# Patient Record
Sex: Male | Born: 1970
Health system: Southern US, Community
[De-identification: ages and names within clinical notes are randomized; demographics above are authoritative.]

## PROBLEM LIST (undated history)

## (undated) DIAGNOSIS — N200 Calculus of kidney: Secondary | ICD-10-CM

## (undated) DIAGNOSIS — I447 Left bundle-branch block, unspecified: Secondary | ICD-10-CM

## (undated) DIAGNOSIS — I10 Essential (primary) hypertension: Secondary | ICD-10-CM

## (undated) DIAGNOSIS — E119 Type 2 diabetes mellitus without complications: Secondary | ICD-10-CM

## (undated) DIAGNOSIS — E785 Hyperlipidemia, unspecified: Secondary | ICD-10-CM

## (undated) HISTORY — DX: Left bundle-branch block, unspecified: I44.7

## (undated) HISTORY — DX: Essential (primary) hypertension: I10

## (undated) HISTORY — DX: Hyperlipidemia, unspecified: E78.5

---

## 2001-07-06 ENCOUNTER — Encounter: Payer: Self-pay | Admitting: Emergency Medicine

## 2001-07-06 ENCOUNTER — Emergency Department (HOSPITAL_COMMUNITY): Admission: EM | Admit: 2001-07-06 | Discharge: 2001-07-06 | Payer: Self-pay | Admitting: Emergency Medicine

## 2002-06-29 ENCOUNTER — Encounter: Payer: Self-pay | Admitting: Family Medicine

## 2002-06-29 ENCOUNTER — Encounter: Admission: RE | Admit: 2002-06-29 | Discharge: 2002-06-29 | Payer: Self-pay | Admitting: Family Medicine

## 2005-01-11 ENCOUNTER — Encounter: Admission: RE | Admit: 2005-01-11 | Discharge: 2005-01-11 | Payer: Self-pay | Admitting: Occupational Medicine

## 2005-01-18 ENCOUNTER — Encounter: Admission: RE | Admit: 2005-01-18 | Discharge: 2005-01-18 | Payer: Self-pay | Admitting: Occupational Medicine

## 2009-04-06 ENCOUNTER — Emergency Department (HOSPITAL_COMMUNITY): Admission: EM | Admit: 2009-04-06 | Discharge: 2009-04-06 | Payer: Self-pay | Admitting: Emergency Medicine

## 2009-09-01 ENCOUNTER — Emergency Department (HOSPITAL_COMMUNITY): Admission: EM | Admit: 2009-09-01 | Discharge: 2009-09-01 | Payer: Self-pay | Admitting: Emergency Medicine

## 2010-08-15 LAB — DIFFERENTIAL
Basophils Absolute: 0 10*3/uL (ref 0.0–0.1)
Basophils Relative: 1 % (ref 0–1)
Lymphocytes Relative: 32 % (ref 12–46)
Neutro Abs: 2.8 10*3/uL (ref 1.7–7.7)
Neutrophils Relative %: 59 % (ref 43–77)

## 2010-08-15 LAB — COMPREHENSIVE METABOLIC PANEL
Albumin: 4.1 g/dL (ref 3.5–5.2)
Alkaline Phosphatase: 40 U/L (ref 39–117)
BUN: 9 mg/dL (ref 6–23)
Chloride: 104 mEq/L (ref 96–112)
Creatinine, Ser: 1.22 mg/dL (ref 0.4–1.5)
Glucose, Bld: 140 mg/dL — ABNORMAL HIGH (ref 70–99)
Potassium: 3.7 mEq/L (ref 3.5–5.1)
Total Bilirubin: 1.9 mg/dL — ABNORMAL HIGH (ref 0.3–1.2)

## 2010-08-15 LAB — CBC
HCT: 46.9 % (ref 39.0–52.0)
Hemoglobin: 16.1 g/dL (ref 13.0–17.0)
MCV: 88.8 fL (ref 78.0–100.0)
Platelets: 188 10*3/uL (ref 150–400)
RDW: 12.4 % (ref 11.5–15.5)
WBC: 4.7 10*3/uL (ref 4.0–10.5)

## 2010-08-15 LAB — POCT CARDIAC MARKERS
CKMB, poc: <1 ng/mL — ABNORMAL LOW (ref 1.0–8.0)
Myoglobin, poc: 64 ng/mL (ref 12–200)

## 2010-08-15 LAB — LIPASE, BLOOD: Lipase: 38 U/L (ref 11–59)

## 2010-08-29 LAB — DIFFERENTIAL
Basophils Absolute: 0 10*3/uL (ref 0.0–0.1)
Eosinophils Absolute: 0.1 10*3/uL (ref 0.0–0.7)
Eosinophils Relative: 2 % (ref 0–5)
Lymphocytes Relative: 26 % (ref 12–46)
Monocytes Absolute: 0.5 10*3/uL (ref 0.1–1.0)

## 2010-08-29 LAB — BASIC METABOLIC PANEL
BUN: 14 mg/dL (ref 6–23)
CO2: 24 mEq/L (ref 19–32)
Calcium: 9.1 mg/dL (ref 8.4–10.5)
Creatinine, Ser: 1.06 mg/dL (ref 0.4–1.5)
GFR calc non Af Amer: >60 mL/min (ref >60)
Glucose, Bld: 111 mg/dL — ABNORMAL HIGH (ref 70–99)
Sodium: 139 mEq/L (ref 135–145)

## 2010-08-29 LAB — CBC
Hemoglobin: 15.8 g/dL (ref 13.0–17.0)
MCHC: 34.8 g/dL (ref 30.0–36.0)
Platelets: 160 10*3/uL (ref 150–400)
RDW: 12.9 % (ref 11.5–15.5)

## 2010-08-29 LAB — POCT I-STAT, CHEM 8
BUN: 16 mg/dL (ref 6–23)
Calcium, Ion: 1.17 mmol/L (ref 1.12–1.32)
Chloride: 105 mEq/L (ref 96–112)
Glucose, Bld: 105 mg/dL — ABNORMAL HIGH (ref 70–99)
HCT: 46 % (ref 39.0–52.0)
TCO2: 23 mmol/L (ref 0–100)

## 2010-08-29 LAB — POCT CARDIAC MARKERS: Troponin i, poc: <0.05 ng/mL (ref 0.00–0.09)

## 2010-08-29 LAB — CK TOTAL AND CKMB (NOT AT ARMC): CK, MB: 1.7 ng/mL (ref 0.3–4.0)

## 2011-12-09 ENCOUNTER — Ambulatory Visit: Payer: Self-pay

## 2011-12-09 ENCOUNTER — Other Ambulatory Visit: Payer: Self-pay | Admitting: Occupational Medicine

## 2011-12-09 DIAGNOSIS — Z021 Encounter for pre-employment examination: Secondary | ICD-10-CM

## 2012-05-27 HISTORY — PX: HERNIA REPAIR: SHX51

## 2013-02-08 ENCOUNTER — Encounter (INDEPENDENT_AMBULATORY_CARE_PROVIDER_SITE_OTHER): Payer: Self-pay | Admitting: General Surgery

## 2013-02-08 ENCOUNTER — Encounter (INDEPENDENT_AMBULATORY_CARE_PROVIDER_SITE_OTHER): Payer: Self-pay | Admitting: Surgery

## 2013-02-08 ENCOUNTER — Ambulatory Visit (INDEPENDENT_AMBULATORY_CARE_PROVIDER_SITE_OTHER): Payer: BC Managed Care – PPO | Admitting: Surgery

## 2013-02-08 VITALS — BP 114/72 | HR 72 | Temp 98.6°F | Resp 14 | Ht 68.0 in | Wt 185.8 lb

## 2013-02-08 DIAGNOSIS — K4090 Unilateral inguinal hernia, without obstruction or gangrene, not specified as recurrent: Secondary | ICD-10-CM | POA: Insufficient documentation

## 2013-02-08 HISTORY — DX: Unilateral inguinal hernia, without obstruction or gangrene, not specified as recurrent: K40.90

## 2013-02-08 NOTE — Progress Notes (Signed)
Patient ID: Kyle Kyle [name], male   DOB: May 19, 1971, 42 y.o.   MRN: [id number]  Chief Complaint  Patient presents with  . New Evaluation    eval ing bulge    HPI Kyle Kyle [name] is Kyle [name] 42 y.o. male.   HPI This is Kyle [name] very pleasant gentleman referred by Dr. Dulce Sellar for evaluation of Kyle [name] symptomatic right inguinal hernia. He has had Kyle [name] hernia for some time. It is getting larger and causing more discomfort. The pain is moderate in intensity and described as an ache.  He has no nausea or vomiting or obstructive symptoms. He has no other complaints. He reports that The bulge easily reduces Past Medical History  Diagnosis Date  . Hyperlipidemia   . Hypertension   . Left bundle branch block   . Bundle branch block, left     History reviewed. No pertinent past surgical history.  Family History  Problem Relation Age of Onset  . Heart disease Father   . Cancer Maternal Aunt     breast    Social History History  Substance Use Topics  . Smoking status: Never Smoker   . Smokeless tobacco: Never Used  . Alcohol Use: Yes     Comment: rare    Allergies  Allergen Reactions  . Nitroglycerin     syncope     Current Outpatient Prescriptions  Medication Sig Dispense Refill  . amLODipine (NORVASC) 10 MG tablet       . atorvastatin (LIPITOR) 20 MG tablet       . lisinopril (PRINIVIL,ZESTRIL) 5 MG tablet        No current facility-administered medications for this visit.    Review of Systems Review of Systems  Constitutional: Negative for fever, chills and unexpected weight change.  HENT: Negative for hearing loss, congestion, sore throat, trouble swallowing and voice change.   Eyes: Negative for visual disturbance.  Respiratory: Negative for cough and wheezing.   Cardiovascular: Negative for chest pain, palpitations and leg swelling.  Gastrointestinal: Negative for nausea, vomiting, abdominal pain, diarrhea, constipation, blood in stool, abdominal distention, anal bleeding and  rectal pain.  Genitourinary: Negative for hematuria and difficulty urinating.  Musculoskeletal: Negative for arthralgias.  Skin: Negative for rash and wound.  Neurological: Negative for seizures, syncope, weakness and headaches.  Hematological: Negative for adenopathy. Does not bruise/bleed easily.  Psychiatric/Behavioral: Negative for confusion.    Blood pressure 114/72, pulse 72, temperature 98.6 F (37 C), temperature source Temporal, resp. rate 14, height 5\' 8"  (1.727 m), weight 185 lb 12.8 oz (84.278 kg).  Physical Exam Physical Exam  Constitutional: He is oriented to person, place, and time. He appears well-developed and well-nourished. No distress.  HENT:  Head: Normocephalic and atraumatic.  Right Ear: External ear normal.  Left Ear: External ear normal.  Nose: Nose normal.  Mouth/Throat: Oropharynx is clear and moist. No oropharyngeal exudate.  Eyes: Conjunctivae are normal. Pupils are equal, round, and reactive to light. Right eye exhibits no discharge. Left eye exhibits no discharge. No scleral icterus.  Neck: Normal range of motion. Neck supple. No tracheal deviation present.  Cardiovascular: Normal rate, regular rhythm, normal heart sounds and intact distal pulses.   No murmur heard. Pulmonary/Chest: Effort normal and breath sounds normal. No respiratory distress. He has no wheezes.  Abdominal: Soft. Bowel sounds are normal. There is no tenderness. There is no rebound.  There is an easily reducible right inguinal hernia. There is no evidence of left inguinal hernia or umbilical hernia  Musculoskeletal: Normal  range of motion. He exhibits no edema and no tenderness.  Lymphadenopathy:    He has no cervical adenopathy.  Neurological: He is alert and oriented to person, place, and time.  Skin: Skin is warm and dry. No rash noted. He is not diaphoretic. No erythema.  Psychiatric: His behavior is normal. Judgment normal.    Data Reviewed   Assessment    Right inguinal  hernia     Plan    Repair with mesh was recommended. I discussed both the open and laparoscopic techniques. I would recommend open as he does Kyle [name] lot of vigorous heavy lifting at work. I discussed the risk of surgery which includes but is not limited to bleeding, infection, chronic pain, nerve entrapment, recurrence, et Karie Soda. He understands and wishes to proceed. Surgery will be scheduled. He is requesting November 21st.        Kyle Kyle [name] 02/08/2013, 2:38 PM

## 2013-02-20 ENCOUNTER — Other Ambulatory Visit: Payer: Self-pay | Admitting: Cardiology

## 2013-02-20 DIAGNOSIS — Z79899 Other long term (current) drug therapy: Secondary | ICD-10-CM

## 2013-02-20 DIAGNOSIS — E7800 Pure hypercholesterolemia, unspecified: Secondary | ICD-10-CM

## 2013-02-25 ENCOUNTER — Other Ambulatory Visit: Payer: Self-pay

## 2013-03-01 ENCOUNTER — Other Ambulatory Visit (INDEPENDENT_AMBULATORY_CARE_PROVIDER_SITE_OTHER): Payer: Federal, State, Local not specified - PPO

## 2013-03-01 DIAGNOSIS — E7800 Pure hypercholesterolemia, unspecified: Secondary | ICD-10-CM

## 2013-03-01 DIAGNOSIS — Z79899 Other long term (current) drug therapy: Secondary | ICD-10-CM

## 2013-03-01 LAB — LIPID PANEL
Cholesterol: 118 mg/dL (ref 0–200)
HDL: 39 mg/dL — ABNORMAL LOW (ref >39.00)
LDL Cholesterol: 64 mg/dL (ref 0–99)
VLDL: 15.2 mg/dL (ref 0.0–40.0)

## 2013-03-18 ENCOUNTER — Encounter: Payer: Self-pay | Admitting: Cardiology

## 2013-04-06 ENCOUNTER — Encounter (INDEPENDENT_AMBULATORY_CARE_PROVIDER_SITE_OTHER): Payer: Self-pay

## 2013-04-16 ENCOUNTER — Other Ambulatory Visit (INDEPENDENT_AMBULATORY_CARE_PROVIDER_SITE_OTHER): Payer: Self-pay | Admitting: *Deleted

## 2013-04-16 DIAGNOSIS — K4090 Unilateral inguinal hernia, without obstruction or gangrene, not specified as recurrent: Secondary | ICD-10-CM

## 2013-04-16 MED ORDER — TRAMADOL HCL 50 MG PO TABS: 50.0000 mg | ORAL_TABLET | Freq: Four times a day (QID) | ORAL | Status: DC | PRN

## 2013-04-21 ENCOUNTER — Ambulatory Visit: Payer: Self-pay | Admitting: Cardiology

## 2013-05-04 ENCOUNTER — Ambulatory Visit (INDEPENDENT_AMBULATORY_CARE_PROVIDER_SITE_OTHER): Payer: BC Managed Care – PPO | Admitting: Surgery

## 2013-05-04 ENCOUNTER — Encounter (INDEPENDENT_AMBULATORY_CARE_PROVIDER_SITE_OTHER): Payer: Self-pay | Admitting: Surgery

## 2013-05-04 ENCOUNTER — Encounter (INDEPENDENT_AMBULATORY_CARE_PROVIDER_SITE_OTHER): Payer: Self-pay

## 2013-05-04 VITALS — BP 120/68 | HR 88 | Temp 98.8°F | Resp 14 | Ht 68.0 in | Wt 186.2 lb

## 2013-05-04 DIAGNOSIS — Z09 Encounter for follow-up examination after completed treatment for conditions other than malignant neoplasm: Secondary | ICD-10-CM

## 2013-05-04 NOTE — Progress Notes (Signed)
Subjective:     Patient ID: Kyle [name], male   DOB: [birth year], 42 y.o.   MRN: [id number]  HPI He is here for his first postop visit status post right inguinal hernia repair with mesh. He is doing well and has no complaints.  Review of Systems     Objective:   Physical Exam On exam, there is a normal postoperative swelling. The incision is healing well. There is no evidence of recurrence    Assessment:     Patient stable postop     Plan:     He has returned to work. He'll return to lifting  On December 22. He will come back he develop any discomfort.

## 2013-05-08 ENCOUNTER — Other Ambulatory Visit: Payer: Self-pay | Admitting: Cardiology

## 2013-05-18 ENCOUNTER — Other Ambulatory Visit: Payer: Self-pay | Admitting: Cardiology

## 2013-06-07 ENCOUNTER — Telehealth: Payer: Self-pay

## 2013-06-08 MED ORDER — AMLODIPINE BESYLATE 10 MG PO TABS: 10.0000 mg | ORAL_TABLET | Freq: Every day | ORAL | Status: DC

## 2013-06-08 NOTE — Telephone Encounter (Signed)
Rx refilled.

## 2013-12-17 ENCOUNTER — Telehealth: Payer: Self-pay

## 2013-12-17 MED ORDER — ATORVASTATIN CALCIUM 20 MG PO TABS: ORAL_TABLET | ORAL | Status: DC

## 2013-12-17 NOTE — Telephone Encounter (Signed)
Refilled

## 2014-01-07 ENCOUNTER — Other Ambulatory Visit: Payer: Self-pay

## 2014-01-07 ENCOUNTER — Telehealth: Payer: Self-pay

## 2014-01-07 MED ORDER — LISINOPRIL 5 MG PO TABS: 5.0000 mg | ORAL_TABLET | Freq: Every day | ORAL | Status: DC

## 2014-01-07 NOTE — Telephone Encounter (Signed)
Yes, ok to refill. Pt due to see Dr. Anne FuSkains 9/15.

## 2014-02-14 ENCOUNTER — Other Ambulatory Visit: Payer: Self-pay | Admitting: *Deleted

## 2014-02-14 MED ORDER — ATORVASTATIN CALCIUM 20 MG PO TABS: ORAL_TABLET | ORAL | Status: DC

## 2014-02-21 ENCOUNTER — Ambulatory Visit (INDEPENDENT_AMBULATORY_CARE_PROVIDER_SITE_OTHER): Payer: Federal, State, Local not specified - PPO | Admitting: Cardiology

## 2014-02-21 ENCOUNTER — Encounter: Payer: Self-pay | Admitting: Cardiology

## 2014-02-21 VITALS — BP 118/82 | HR 82 | Ht 68.0 in | Wt 190.0 lb

## 2014-02-21 DIAGNOSIS — E785 Hyperlipidemia, unspecified: Secondary | ICD-10-CM | POA: Insufficient documentation

## 2014-02-21 DIAGNOSIS — I447 Left bundle-branch block, unspecified: Secondary | ICD-10-CM | POA: Insufficient documentation

## 2014-02-21 DIAGNOSIS — I10 Essential (primary) hypertension: Secondary | ICD-10-CM | POA: Insufficient documentation

## 2014-02-21 NOTE — Progress Notes (Signed)
      [address] [place], [place]  [address] Phone: [phone] Fax:  [phone]  Date:  02/21/2014   ID:  Kyle [name], DOB [birth year], MRN [id number]  PCP:  No primary provider on file.   History of Present Illness: Kyle [name] is a 43 y.o. male with a hx of LBBB with low risk nuclear stress test 01/15/10 and hypertension here for followup. Previously had hernia repair by Dr. Roxanne Gates hernia repair. He states that it hurt quite a bit for approximately a day. He carries around a copy of his EKG with left bundle branch block. Overall he does very well, no chest pain, no shortness of breath, no fevers, no strokelike symptoms. No decreased exercise tolerance..  Patient denies palpitations, dizziness, syncope, swelling, nor PND.    Wt Readings from Last 3 Encounters:  02/21/14 190 lb (86.183 kg)  05/04/13 186 lb 3.2 oz (84.46 kg)  02/08/13 185 lb 12.8 oz (84.278 kg)     Past Medical History  Diagnosis Date  . Hyperlipidemia   . Hypertension   . Left bundle branch block   . Bundle branch block, left     No past surgical history on file.  Current Outpatient Prescriptions  Medication Sig Dispense Refill  . amLODipine (NORVASC) 10 MG tablet Take 1 tablet (10 mg total) by mouth daily.  30 tablet  5  . atorvastatin (LIPITOR) 20 MG tablet TAKE 1 TABLET BY MOUTH EVERY DAY  30 tablet  0  . lisinopril (PRINIVIL,ZESTRIL) 5 MG tablet Take 1 tablet (5 mg total) by mouth daily.  30 tablet  1   No current facility-administered medications for this visit.    Allergies:    Allergies  Allergen Reactions  . Nitroglycerin     syncope     Social History:  The patient  reports that he has never smoked. He has never used smokeless tobacco. He reports that he drinks alcohol. He reports that he does not use illicit drugs.   Family History  Problem Relation Age of Onset  . Heart attack Father   . Cancer Maternal Aunt     breast    ROS:  Please see  the history of present illness.   Denies any syncope, bleeding, orthopnea, PND  All other systems reviewed and negative.   PHYSICAL EXAM: VS:  BP 118/82  Pulse 82  Ht  (1.727 m)  Wt 190 lb (86.183 kg)  BMI 28.90 kg/m2 Well nourished, well developed, in no acute distress HEENT: normal, [place]/AT, EOMI Neck: no JVD, normal carotid upstroke, no bruit Cardiac:  normal S1, S2; RRR; no murmur Lungs:  clear to auscultation bilaterally, no wheezing, rhonchi or rales Abd: soft, nontender, no hepatomegaly, no bruits Ext: no edema, 2+ distal pulses Skin: warm and dry GU: deferred Neuro: no focal abnormalities noted, AAO x 3  EKG:  02/21/14-sinus rhythm, left bundle branch block    heart rate 82 beats per minute   ASSESSMENT AND PLAN:  1. Left bundle branch block-stable, chronic, prior nuclear stress test in 2011 showed no ischemia. Reassuring. 2. HTN - lisinopril, well controlled. 3. Hyperlipidemia - no longer on atorvastatin. Doing well. Trying to eat well 4. One-year followup  Signed, Donato Schultz, MD [facility]  02/21/2014 4:06 PM

## 2014-02-21 NOTE — Patient Instructions (Signed)
The current medical regimen is effective;  continue present plan and medications.  Follow up in 1 year with Dr Skains.  You will receive a letter in the mail 2 months before you are due.  Please call us when you receive this letter to schedule your follow up appointment.  

## 2014-03-01 ENCOUNTER — Other Ambulatory Visit: Payer: Self-pay | Admitting: Cardiology

## 2014-03-10 ENCOUNTER — Other Ambulatory Visit: Payer: Self-pay

## 2014-03-10 MED ORDER — LISINOPRIL 5 MG PO TABS: 5.0000 mg | ORAL_TABLET | Freq: Every day | ORAL | Status: DC

## 2014-03-12 ENCOUNTER — Other Ambulatory Visit: Payer: Self-pay | Admitting: Cardiology

## 2014-03-12 NOTE — Telephone Encounter (Signed)
Rx was sent to pharmacy electronically. 

## 2014-04-04 ENCOUNTER — Other Ambulatory Visit: Payer: Self-pay | Admitting: Cardiology

## 2014-07-30 ENCOUNTER — Other Ambulatory Visit: Payer: Self-pay | Admitting: Cardiology

## 2014-09-13 ENCOUNTER — Other Ambulatory Visit: Payer: Self-pay | Admitting: Family Medicine

## 2014-09-13 ENCOUNTER — Ambulatory Visit
Admission: RE | Admit: 2014-09-13 | Discharge: 2014-09-13 | Disposition: A | Discharge status: Home / Self Care | Payer: Federal, State, Local not specified - PPO | Source: Ambulatory Visit | Attending: Family Medicine | Admitting: Family Medicine

## 2014-09-13 DIAGNOSIS — R05 Cough: Secondary | ICD-10-CM

## 2014-09-13 DIAGNOSIS — R053 Chronic cough: Secondary | ICD-10-CM

## 2014-11-06 ENCOUNTER — Other Ambulatory Visit: Payer: Self-pay | Admitting: Cardiology

## 2014-12-12 ENCOUNTER — Other Ambulatory Visit: Payer: Self-pay | Admitting: Cardiology

## 2015-01-31 ENCOUNTER — Other Ambulatory Visit: Payer: Self-pay | Admitting: Cardiology

## 2015-02-21 ENCOUNTER — Ambulatory Visit (INDEPENDENT_AMBULATORY_CARE_PROVIDER_SITE_OTHER): Payer: Federal, State, Local not specified - PPO | Admitting: Cardiology

## 2015-02-21 ENCOUNTER — Encounter: Payer: Self-pay | Admitting: Cardiology

## 2015-02-21 VITALS — BP 118/72 | HR 87 | Ht 68.0 in | Wt 191.8 lb

## 2015-02-21 DIAGNOSIS — R058 Other specified cough: Secondary | ICD-10-CM

## 2015-02-21 DIAGNOSIS — E785 Hyperlipidemia, unspecified: Secondary | ICD-10-CM

## 2015-02-21 DIAGNOSIS — I447 Left bundle-branch block, unspecified: Secondary | ICD-10-CM | POA: Diagnosis not present

## 2015-02-21 DIAGNOSIS — R05 Cough: Secondary | ICD-10-CM

## 2015-02-21 DIAGNOSIS — I10 Essential (primary) hypertension: Secondary | ICD-10-CM | POA: Diagnosis not present

## 2015-02-21 DIAGNOSIS — T464X5A Adverse effect of angiotensin-converting-enzyme inhibitors, initial encounter: Secondary | ICD-10-CM

## 2015-02-21 NOTE — Progress Notes (Signed)
[address] [place], [place]  [address] Phone: [phone] Fax:  [phone]  Date:  02/21/2015   ID:  Kyle [name], DOB 27-Sep-1970, MRN [id number]  PCP:  No primary care Kyle [name] on file.   History of Present Illness: Kyle [name] is a 44 y.o. male with a hx of LBBB with low risk nuclear stress test 01/15/10 and hypertension here for followup. His main complete today is cough. He saw Kyle [name] previously to suggested that one of his medications could be causing the cough. ACE inhibitor. No productive mucus.   Sometimes when he wakes up after laying in bed on Saturday he feels as though he said taken 3 deep breaths before he starts his day. Otherwise, no dyspnea on exertion. He still is working for a Chiropractor.   Previously had hernia repair by Dr. Roxanne [name] hernia repair. He states that it hurt quite a bit for approximately a day. He carries around a copy of his EKG with left bundle branch block. Overall he does very well, no chest pain, no shortness of breath, no fevers, no strokelike symptoms. No decreased exercise tolerance.  [employer] fan.   Patient denies palpitations, dizziness, syncope, swelling, nor PND.    Wt Readings from Last 3 Encounters:  02/21/15 191 lb 12.8 oz (87 kg)  02/21/14 190 lb (86.183 kg)  05/04/13 186 lb 3.2 oz (84.46 kg)     Past Medical History  Diagnosis Date  . Hyperlipidemia   . Hypertension   . Left bundle branch block   . Bundle branch block, left     Past Surgical History  Procedure Laterality Date  . Hernia repair Right 2014    Current Outpatient Prescriptions  Medication Sig Dispense Refill  . amLODipine (NORVASC) 10 MG tablet TAKE 1 TABLET BY MOUTH DAILY 30 tablet 0  . atorvastatin (LIPITOR) 20 MG tablet TAKE 1 TABLET BY MOUTH EVERY DAY 30 tablet 11  . lisinopril (PRINIVIL,ZESTRIL) 5 MG tablet TAKE 1 TABLET (5 MG TOTAL) BY MOUTH DAILY. 30 tablet 2   No current  facility-administered medications for this visit.    Allergies:    Allergies  Allergen Reactions  . Nitroglycerin Other (See Comments)    syncope     Social History:  The patient  reports that he has never smoked. He has never used smokeless tobacco. He reports that he drinks alcohol. He reports that he does not use illicit drugs.   Family History  Problem Relation Age of Onset  . Heart attack Father   . Cancer Maternal Aunt     breast    ROS:  Please see the history of present illness.   Denies any syncope, bleeding, orthopnea, PND  All other systems reviewed and negative.   PHYSICAL EXAM: VS:  BP 118/72 mmHg  Pulse 87  Ht  (1.727 m)  Wt 191 lb 12.8 oz (87 kg)  BMI 29.17 kg/m2 Well nourished, well developed, in no acute distress HEENT: normal, Kyle [name]/AT, EOMI Neck: no JVD, normal carotid upstroke, no bruit Cardiac:  normal S1, S2; RRR; no murmur Lungs:  clear to auscultation bilaterally, no wheezing, rhonchi or rales Abd: soft, nontender, no hepatomegaly, no bruits Ext: no edema, 2+ distal pulses Skin: warm and dry GU: deferred Neuro: no focal abnormalities noted, AAO x 3  EKG:  Today 02/21/15-sinus rhythm, left bundle branch block heart rate 87 beats per minute. Personally viewed-no change with prior  ASSESSMENT AND  PLAN:  1. Left bundle branch block-stable, chronic, prior nuclear stress test in 2011 showed no ischemia. Reassuring. 2. HTN - amlodipine, well controlled. We are stopping his ACE inhibitor because of presumed ACE inhibitor cough. We will see how this helps. If cough does not improve, he will call me, thankfully he has had a chest x-ray on 09/13/14 which showed no active disease. Prior ejection fraction was normal on stress test. 3. Hyperlipidemia - on atorvastatin 20 mg. Doing well. Trying to eat well, checking lipid profile and ALT 4. One-year followup  Signed, Kyle Schultz, MD [facility]  02/21/2015 4:11 PM

## 2015-02-21 NOTE — Patient Instructions (Signed)
Medication Instructions:  Please stop your Lisinopril.  Continue all other medications as listed.  Labwork: Please have blood work today (Lipid/ALT)  Follow-Up: Follow up in 1 year with Dr. Anne Fu.  You will receive a letter in the mail 2 months before you are due.  Please call us when you receive this letter to schedule your follow up appointment.  Thank you for choosing [facility]!!

## 2015-02-22 LAB — LIPID PANEL
Cholesterol: 135 mg/dL (ref 0–200)
HDL: 32.7 mg/dL — ABNORMAL LOW (ref >39.00)
NonHDL: 102.57
Total CHOL/HDL Ratio: 4
Triglycerides: 213 mg/dL — ABNORMAL HIGH (ref 0.0–149.0)
VLDL: 42.6 mg/dL — ABNORMAL HIGH (ref 0.0–40.0)

## 2015-02-22 LAB — ALT: ALT: 34 U/L (ref 0–53)

## 2015-02-22 LAB — LDL CHOLESTEROL, DIRECT: LDL DIRECT: 92 mg/dL

## 2015-03-01 ENCOUNTER — Telehealth: Payer: Self-pay | Admitting: Cardiology

## 2015-03-01 NOTE — Telephone Encounter (Signed)
New Message   Pt calling concerning lab results. Can use listed # until 1 pm, then please use mobile [phone] #. Please call back and discuss.

## 2015-03-01 NOTE — Telephone Encounter (Signed)
Reviewed results of lipid and ALT with pt who stated understanding.  He has no further questions

## 2015-03-04 ENCOUNTER — Other Ambulatory Visit: Payer: Self-pay | Admitting: Cardiology

## 2015-03-11 DIAGNOSIS — T07XXXA Unspecified multiple injuries, initial encounter: Secondary | ICD-10-CM | POA: Insufficient documentation

## 2015-03-15 DIAGNOSIS — S2243XA Multiple fractures of ribs, bilateral, initial encounter for closed fracture: Secondary | ICD-10-CM | POA: Insufficient documentation

## 2015-03-15 DIAGNOSIS — S37011A Minor contusion of right kidney, initial encounter: Secondary | ICD-10-CM | POA: Insufficient documentation

## 2015-03-15 DIAGNOSIS — S42031D Displaced fracture of lateral end of right clavicle, subsequent encounter for fracture with routine healing: Secondary | ICD-10-CM | POA: Insufficient documentation

## 2015-03-15 DIAGNOSIS — Z938 Other artificial opening status: Secondary | ICD-10-CM | POA: Insufficient documentation

## 2015-03-15 DIAGNOSIS — J90 Pleural effusion, not elsewhere classified: Secondary | ICD-10-CM | POA: Insufficient documentation

## 2015-03-15 DIAGNOSIS — S2249XA Multiple fractures of ribs, unspecified side, initial encounter for closed fracture: Secondary | ICD-10-CM | POA: Insufficient documentation

## 2015-03-15 DIAGNOSIS — J9811 Atelectasis: Secondary | ICD-10-CM | POA: Insufficient documentation

## 2015-03-30 ENCOUNTER — Other Ambulatory Visit: Payer: Self-pay | Admitting: Cardiology

## 2016-01-18 ENCOUNTER — Other Ambulatory Visit: Payer: Self-pay | Admitting: Cardiology

## 2016-02-23 ENCOUNTER — Ambulatory Visit: Payer: Self-pay | Admitting: Cardiology

## 2016-03-18 ENCOUNTER — Other Ambulatory Visit: Payer: Self-pay | Admitting: Cardiology

## 2016-04-12 ENCOUNTER — Ambulatory Visit: Payer: Self-pay | Admitting: Cardiology

## 2016-04-19 ENCOUNTER — Other Ambulatory Visit: Payer: Self-pay | Admitting: Cardiology

## 2016-04-22 ENCOUNTER — Other Ambulatory Visit: Payer: Self-pay | Admitting: *Deleted

## 2016-04-22 ENCOUNTER — Other Ambulatory Visit: Payer: Self-pay | Admitting: Cardiology

## 2016-04-22 MED ORDER — ATORVASTATIN CALCIUM 20 MG PO TABS: 20.0000 mg | ORAL_TABLET | Freq: Every day | ORAL | 0 refills | Status: DC

## 2016-04-22 MED ORDER — AMLODIPINE BESYLATE 10 MG PO TABS: 10.0000 mg | ORAL_TABLET | Freq: Every day | ORAL | 0 refills | Status: DC

## 2016-04-23 NOTE — Telephone Encounter (Signed)
Medication Detail    Disp Refills Start End   atorvastatin (LIPITOR) 20 MG tablet 30 tablet 0 04/22/2016    Sig - Route: Take 1 tablet (20 mg total) by mouth daily. - Oral   Notes to Pharmacy: Please make appointment with Dr. Anne FuSkains for further refills. Final attempt   E-Prescribing Status: Receipt confirmed by pharmacy (04/22/2016 9:59 AM EST)   Pharmacy   [facility] [phone]#[id number] - [place], [place] - [address]

## 2016-05-03 ENCOUNTER — Encounter (INDEPENDENT_AMBULATORY_CARE_PROVIDER_SITE_OTHER): Payer: Self-pay

## 2016-05-03 ENCOUNTER — Ambulatory Visit (INDEPENDENT_AMBULATORY_CARE_PROVIDER_SITE_OTHER): Payer: Federal, State, Local not specified - PPO | Admitting: Cardiology

## 2016-05-03 ENCOUNTER — Encounter: Payer: Self-pay | Admitting: Cardiology

## 2016-05-03 VITALS — BP 132/84 | HR 79 | Ht 68.0 in | Wt 194.0 lb

## 2016-05-03 DIAGNOSIS — Z79899 Other long term (current) drug therapy: Secondary | ICD-10-CM

## 2016-05-03 DIAGNOSIS — E7800 Pure hypercholesterolemia, unspecified: Secondary | ICD-10-CM | POA: Diagnosis not present

## 2016-05-03 DIAGNOSIS — I447 Left bundle-branch block, unspecified: Secondary | ICD-10-CM | POA: Diagnosis not present

## 2016-05-03 DIAGNOSIS — I10 Essential (primary) hypertension: Secondary | ICD-10-CM

## 2016-05-03 LAB — ALT: ALT: 44 U/L (ref 9–46)

## 2016-05-03 LAB — LIPID PANEL
CHOLESTEROL: 131 mg/dL (ref <200)
HDL: 42 mg/dL (ref >40)
LDL Cholesterol: 70 mg/dL (ref <100)
TRIGLYCERIDES: 97 mg/dL (ref <150)
Total CHOL/HDL Ratio: 3.1 Ratio (ref <5.0)
VLDL: 19 mg/dL (ref <30)

## 2016-05-03 MED ORDER — ATORVASTATIN CALCIUM 20 MG PO TABS: 20.0000 mg | ORAL_TABLET | Freq: Every day | ORAL | 11 refills | Status: DC

## 2016-05-03 MED ORDER — AMLODIPINE BESYLATE 10 MG PO TABS: 10.0000 mg | ORAL_TABLET | Freq: Every day | ORAL | 11 refills | Status: DC

## 2016-05-03 NOTE — Patient Instructions (Signed)
Medication Instructions:  The current medical regimen is effective;  continue present plan and medications.  Labwork: Please have blood work today (Lipid/ALT)  Follow-Up: Follow up in 1 year with Dr. Skains.  You will receive a letter in the mail 2 months before you are due.  Please call us when you receive this letter to schedule your follow up appointment.  If you need a refill on your cardiac medications before your next appointment, please call your pharmacy.  Thank you for choosing [facility]!!      
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
## 2016-05-08 ENCOUNTER — Telehealth: Payer: Self-pay | Admitting: Cardiology

## 2016-05-08 NOTE — Telephone Encounter (Signed)
New message  Pt's wife is returning calls for lab results  Please call back

## 2016-05-08 NOTE — Telephone Encounter (Signed)
Left message at # listed to c/b for results.

## 2016-05-10 ENCOUNTER — Encounter: Payer: Self-pay | Admitting: *Deleted

## 2016-05-10 NOTE — Telephone Encounter (Signed)
Have attempted multiple times to reach pt by phone.  Letter of results mailed to pt's home address.

## 2016-08-19 DIAGNOSIS — K080 Exfoliation of teeth due to systemic causes: Secondary | ICD-10-CM | POA: Diagnosis not present

## 2016-08-29 DIAGNOSIS — K080 Exfoliation of teeth due to systemic causes: Secondary | ICD-10-CM | POA: Diagnosis not present

## 2016-09-25 DIAGNOSIS — K080 Exfoliation of teeth due to systemic causes: Secondary | ICD-10-CM | POA: Diagnosis not present

## 2016-10-19 ENCOUNTER — Inpatient Hospital Stay (HOSPITAL_COMMUNITY)
Admission: EM | Admit: 2016-10-19 | Discharge: 2016-10-23 | DRG: 419 | Disposition: A | Discharge status: Home / Self Care | Payer: Federal, State, Local not specified - PPO | Attending: General Surgery | Admitting: General Surgery

## 2016-10-19 ENCOUNTER — Emergency Department (HOSPITAL_COMMUNITY): Payer: Federal, State, Local not specified - PPO

## 2016-10-19 ENCOUNTER — Encounter (HOSPITAL_COMMUNITY): Payer: Self-pay | Admitting: Emergency Medicine

## 2016-10-19 DIAGNOSIS — K8050 Calculus of bile duct without cholangitis or cholecystitis without obstruction: Secondary | ICD-10-CM | POA: Diagnosis present

## 2016-10-19 DIAGNOSIS — N200 Calculus of kidney: Secondary | ICD-10-CM | POA: Diagnosis not present

## 2016-10-19 DIAGNOSIS — R1011 Right upper quadrant pain: Secondary | ICD-10-CM | POA: Diagnosis not present

## 2016-10-19 DIAGNOSIS — K8000 Calculus of gallbladder with acute cholecystitis without obstruction: Secondary | ICD-10-CM

## 2016-10-19 DIAGNOSIS — Z888 Allergy status to other drugs, medicaments and biological substances status: Secondary | ICD-10-CM | POA: Diagnosis not present

## 2016-10-19 DIAGNOSIS — R17 Unspecified jaundice: Secondary | ICD-10-CM

## 2016-10-19 DIAGNOSIS — Z803 Family history of malignant neoplasm of breast: Secondary | ICD-10-CM

## 2016-10-19 DIAGNOSIS — R1013 Epigastric pain: Secondary | ICD-10-CM | POA: Diagnosis not present

## 2016-10-19 DIAGNOSIS — Z87442 Personal history of urinary calculi: Secondary | ICD-10-CM

## 2016-10-19 DIAGNOSIS — K8020 Calculus of gallbladder without cholecystitis without obstruction: Secondary | ICD-10-CM | POA: Diagnosis not present

## 2016-10-19 DIAGNOSIS — R109 Unspecified abdominal pain: Secondary | ICD-10-CM | POA: Diagnosis not present

## 2016-10-19 DIAGNOSIS — Z8249 Family history of ischemic heart disease and other diseases of the circulatory system: Secondary | ICD-10-CM | POA: Diagnosis not present

## 2016-10-19 DIAGNOSIS — I10 Essential (primary) hypertension: Secondary | ICD-10-CM | POA: Diagnosis not present

## 2016-10-19 DIAGNOSIS — I447 Left bundle-branch block, unspecified: Secondary | ICD-10-CM | POA: Diagnosis not present

## 2016-10-19 DIAGNOSIS — E784 Other hyperlipidemia: Secondary | ICD-10-CM | POA: Diagnosis not present

## 2016-10-19 DIAGNOSIS — E785 Hyperlipidemia, unspecified: Secondary | ICD-10-CM | POA: Diagnosis present

## 2016-10-19 DIAGNOSIS — K8051 Calculus of bile duct without cholangitis or cholecystitis with obstruction: Secondary | ICD-10-CM | POA: Diagnosis not present

## 2016-10-19 DIAGNOSIS — K819 Cholecystitis, unspecified: Secondary | ICD-10-CM | POA: Diagnosis not present

## 2016-10-19 DIAGNOSIS — R011 Cardiac murmur, unspecified: Secondary | ICD-10-CM

## 2016-10-19 DIAGNOSIS — K8066 Calculus of gallbladder and bile duct with acute and chronic cholecystitis without obstruction: Secondary | ICD-10-CM | POA: Diagnosis not present

## 2016-10-19 DIAGNOSIS — K8012 Calculus of gallbladder with acute and chronic cholecystitis without obstruction: Secondary | ICD-10-CM | POA: Diagnosis not present

## 2016-10-19 HISTORY — DX: Calculus of kidney: N20.0

## 2016-10-19 HISTORY — DX: Calculus of bile duct without cholangitis or cholecystitis without obstruction: K80.50

## 2016-10-19 LAB — COMPREHENSIVE METABOLIC PANEL
ALBUMIN: 4.8 g/dL (ref 3.5–5.0)
ALK PHOS: 61 U/L (ref 38–126)
ALT: 58 U/L (ref 17–63)
AST: 26 U/L (ref 15–41)
Anion gap: 13 (ref 5–15)
BUN: 15 mg/dL (ref 6–20)
CO2: 24 mmol/L (ref 22–32)
Calcium: 9.5 mg/dL (ref 8.9–10.3)
Chloride: 103 mmol/L (ref 101–111)
Creatinine, Ser: 1.16 mg/dL (ref 0.61–1.24)
GFR calc Af Amer: >60 mL/min (ref >60)
GFR calc non Af Amer: >60 mL/min (ref >60)
Glucose, Bld: 206 mg/dL — ABNORMAL HIGH (ref 65–99)
POTASSIUM: 3.5 mmol/L (ref 3.5–5.1)
Sodium: 140 mmol/L (ref 135–145)
TOTAL PROTEIN: 8.4 g/dL — AB (ref 6.5–8.1)
Total Bilirubin: 2.3 mg/dL — ABNORMAL HIGH (ref 0.3–1.2)

## 2016-10-19 LAB — CBC WITH DIFFERENTIAL/PLATELET
BASOS ABS: 0 10*3/uL (ref 0.0–0.1)
Basophils Relative: 0 %
Eosinophils Absolute: 0.1 10*3/uL (ref 0.0–0.7)
Eosinophils Relative: 1 %
HEMATOCRIT: 49.1 % (ref 39.0–52.0)
Hemoglobin: 17.7 g/dL — ABNORMAL HIGH (ref 13.0–17.0)
LYMPHS PCT: 15 %
Lymphs Abs: 1.6 10*3/uL (ref 0.7–4.0)
MCH: 30.5 pg (ref 26.0–34.0)
MCHC: 36 g/dL (ref 30.0–36.0)
MCV: 84.7 fL (ref 78.0–100.0)
MONO ABS: 0.7 10*3/uL (ref 0.1–1.0)
Monocytes Relative: 7 %
NEUTROS ABS: 7.9 10*3/uL — AB (ref 1.7–7.7)
Neutrophils Relative %: 77 %
Platelets: 184 10*3/uL (ref 150–400)
RBC: 5.8 MIL/uL (ref 4.22–5.81)
RDW: 12.3 % (ref 11.5–15.5)
WBC: 10.4 10*3/uL (ref 4.0–10.5)

## 2016-10-19 LAB — URINALYSIS, ROUTINE W REFLEX MICROSCOPIC
Bilirubin Urine: NEGATIVE
Glucose, UA: 150 mg/dL — AB
HGB URINE DIPSTICK: NEGATIVE
Ketones, ur: NEGATIVE mg/dL
Leukocytes, UA: NEGATIVE
Nitrite: NEGATIVE
PROTEIN: NEGATIVE mg/dL
Specific Gravity, Urine: 1.013 (ref 1.005–1.030)
pH: 6 (ref 5.0–8.0)

## 2016-10-19 LAB — LIPASE, BLOOD: Lipase: 41 U/L (ref 11–51)

## 2016-10-19 MED ORDER — DICYCLOMINE HCL 10 MG/ML IM SOLN
20.0000 mg | Freq: Once | INTRAMUSCULAR | Status: AC
  Administered 2016-10-19: 20 mg via INTRAMUSCULAR
  Filled 2016-10-19: qty 2

## 2016-10-19 MED ORDER — ENOXAPARIN SODIUM 40 MG/0.4ML ~~LOC~~ SOLN
40.0000 mg | SUBCUTANEOUS | Status: DC
  Filled 2016-10-19: qty 0.4

## 2016-10-19 MED ORDER — ONDANSETRON HCL 4 MG/2ML IJ SOLN
4.0000 mg | Freq: Once | INTRAMUSCULAR | Status: AC
  Administered 2016-10-19: 4 mg via INTRAVENOUS
  Filled 2016-10-19: qty 2

## 2016-10-19 MED ORDER — DEXTROSE-NACL 5-0.9 % IV SOLN
INTRAVENOUS | Status: DC
  Administered 2016-10-19: 23:00:00 via INTRAVENOUS

## 2016-10-19 MED ORDER — ACETAMINOPHEN 650 MG RE SUPP: 650.0000 mg | Freq: Four times a day (QID) | RECTAL | Status: DC | PRN

## 2016-10-19 MED ORDER — FENTANYL CITRATE (PF) 100 MCG/2ML IJ SOLN
50.0000 ug | INTRAMUSCULAR | Status: DC | PRN
  Administered 2016-10-19: 50 ug via INTRAVENOUS
  Filled 2016-10-19: qty 2

## 2016-10-19 MED ORDER — ONDANSETRON HCL 4 MG/2ML IJ SOLN: 4.0000 mg | Freq: Four times a day (QID) | INTRAMUSCULAR | Status: DC | PRN

## 2016-10-19 MED ORDER — FAMOTIDINE IN NACL 20-0.9 MG/50ML-% IV SOLN
20.0000 mg | Freq: Once | INTRAVENOUS | Status: AC
  Administered 2016-10-19: 20 mg via INTRAVENOUS
  Filled 2016-10-19: qty 50

## 2016-10-19 MED ORDER — SODIUM CHLORIDE 0.9 % IV BOLUS (SEPSIS)
1000.0000 mL | Freq: Once | INTRAVENOUS | Status: AC
  Administered 2016-10-19: 1000 mL via INTRAVENOUS

## 2016-10-19 MED ORDER — ONDANSETRON HCL 4 MG PO TABS: 4.0000 mg | ORAL_TABLET | Freq: Four times a day (QID) | ORAL | Status: DC | PRN

## 2016-10-19 MED ORDER — AMLODIPINE BESYLATE 5 MG PO TABS
10.0000 mg | ORAL_TABLET | Freq: Every day | ORAL | Status: DC
  Administered 2016-10-19 – 2016-10-22 (×4): 10 mg via ORAL
  Filled 2016-10-19 (×4): qty 2

## 2016-10-19 MED ORDER — ATORVASTATIN CALCIUM 20 MG PO TABS
20.0000 mg | ORAL_TABLET | Freq: Every day | ORAL | Status: DC
  Administered 2016-10-19 – 2016-10-22 (×4): 20 mg via ORAL
  Filled 2016-10-19 (×4): qty 1

## 2016-10-19 MED ORDER — ACETAMINOPHEN 325 MG PO TABS
650.0000 mg | ORAL_TABLET | Freq: Four times a day (QID) | ORAL | Status: DC | PRN
  Administered 2016-10-20: 650 mg via ORAL
  Filled 2016-10-19: qty 2

## 2016-10-19 MED ORDER — FENTANYL CITRATE (PF) 100 MCG/2ML IJ SOLN
50.0000 ug | INTRAMUSCULAR | Status: DC | PRN
Start: 2016-10-19 — End: 2016-10-23

## 2016-10-19 NOTE — ED Triage Notes (Signed)
Epigastric pain started radiates to rt side since 1500 today

## 2016-10-19 NOTE — ED Provider Notes (Signed)
[facility]Y DEPT Provider Note   CSN: [id number] Arrival date & time: 10/19/16  1850     History   Chief Complaint Chief Complaint  Patient presents with  . Abdominal Pain    HPI Kyle [name] is a 46 y.o. male.  HPI  Pt was seen at 1910.  Per pt, c/o gradual onset and persistence of constant acute flair of his chronic mid-epigastric abd "pain" intermittently for the past 1.5 months, worse since 1500 today. Has been associated with multiple intermittent episodes of N/V.  Describes the abd pain as radiating into the right side of his abd. Denies any change in his usual chronic pain pattern. Denies f/u with PMD or GI MD for his recurrent symptoms. Denies diarrhea, no fevers, no back pain, no rash, no CP/SOB, no black or blood in stools or emesis.      Past Medical History:  Diagnosis Date  . Hyperlipidemia   . Hypertension   . Kidney stone   . Left bundle branch block     Patient Active Problem List   Diagnosis Date Noted  . Essential hypertension 02/21/2014  . LBBB (left bundle branch block) 02/21/2014  . Hyperlipemia 02/21/2014  . Right inguinal hernia 02/08/2013    Past Surgical History:  Procedure Laterality Date  . HERNIA REPAIR Right 2014       Home Medications    Prior to Admission medications   Medication Sig Start Date End Date Taking? Authorizing Provider  amLODipine (NORVASC) 10 MG tablet Take 1 tablet (10 mg total) by mouth daily. 05/03/16   Jake Bathe, MD  atorvastatin (LIPITOR) 20 MG tablet Take 1 tablet (20 mg total) by mouth daily. 05/03/16   Jake Bathe, MD    Family History Family History  Problem Relation Age of Onset  . Heart attack Father   . Cancer Maternal Aunt        breast    Social History Social History  Substance Use Topics  . Smoking status: Never Smoker  . Smokeless tobacco: Never Used  . Alcohol use Yes     Comment: rare     Allergies   Nitroglycerin   Review of Systems Review of Systems ROS:  Statement: All systems negative except as marked or noted in the HPI; Constitutional: Negative for fever and chills. ; ; Eyes: Negative for eye pain, redness and discharge. ; ; ENMT: Negative for ear pain, hoarseness, nasal congestion, sinus pressure and sore throat. ; ; Cardiovascular: Negative for chest pain, palpitations, diaphoresis, dyspnea and peripheral edema. ; ; Respiratory: Negative for cough, wheezing and stridor. ; ; Gastrointestinal: +N/V, abd pain. Negative for diarrhea, blood in stool, hematemesis, jaundice and rectal bleeding. . ; ; Genitourinary: Negative for dysuria, flank pain and hematuria. ; ; Musculoskeletal: Negative for back pain and neck pain. Negative for swelling and trauma.; ; Skin: Negative for pruritus, rash, abrasions, blisters, bruising and skin lesion.; ; Neuro: Negative for headache, lightheadedness and neck stiffness. Negative for weakness, altered level of consciousness, altered mental status, extremity weakness, paresthesias, involuntary movement, seizure and syncope.      Physical Exam Updated Vital Signs BP (!) 159/104 (BP Location: Right Arm)   Pulse 79   Resp 20   Wt 86.2 kg (190 lb)   SpO2 100%   BMI 28.89 kg/m   Physical Exam 1915: Physical examination:  Nursing notes reviewed; Vital signs and O2 SAT reviewed;  Constitutional: Well developed, Well nourished, Well hydrated, Uncomfortable appearing.; Head:  Normocephalic, atraumatic; Eyes:  EOMI, PERRL, No scleral icterus; ENMT: Mouth and pharynx normal, Mucous membranes moist; Neck: Supple, Full range of motion, No lymphadenopathy; Cardiovascular: Regular rate and rhythm, No gallop; Respiratory: Breath sounds clear & equal bilaterally, No wheezes.  Speaking full sentences with ease, Normal respiratory effort/excursion; Chest: Nontender, Movement normal; Abdomen: Soft, +mid-epigastric and RUQ tenderness to palp. No rebound or guarding. +dry heaving right before exam. Nondistended, Normal bowel sounds;  Genitourinary: No CVA tenderness; Extremities: Pulses normal, No tenderness, No edema, No calf edema or asymmetry.; Neuro: AA&Ox3, Major CN grossly intact.  Speech clear. No gross focal motor or sensory deficits in extremities.; Skin: Color normal, Warm, Dry.   ED Treatments / Results  Labs (all labs ordered are listed, but only abnormal results are displayed)   EKG  EKG Interpretation None       Radiology   Procedures Procedures (including critical care time)  Medications Ordered in ED Medications  fentaNYL (SUBLIMAZE) injection 50 mcg (50 mcg Intravenous Given 10/19/16 1924)  ondansetron (ZOFRAN) injection 4 mg (4 mg Intravenous Given 10/19/16 1912)  dicyclomine (BENTYL) injection 20 mg (20 mg Intramuscular Given 10/19/16 1921)  famotidine (PEPCID) IVPB 20 mg premix (20 mg Intravenous New Bag/Given 10/19/16 1921)  sodium chloride 0.9 % bolus 1,000 mL (1,000 mLs Intravenous New Bag/Given 10/19/16 1921)     Initial Impression / Assessment and Plan / ED Course  I have reviewed the triage vital signs and the nursing notes.  Pertinent labs & imaging results that were available during my care of the patient were reviewed by me and considered in my medical decision making (see chart for details).  MDM Reviewed: previous chart, nursing note and vitals Reviewed previous: labs Interpretation: labs, x-ray and CT scan   Results for orders placed or performed during the hospital encounter of 10/19/16  CBC with Differential  Result Value Ref Range   WBC 10.4 4.0 - 10.5 K/uL   RBC 5.80 4.22 - 5.81 MIL/uL   Hemoglobin 17.7 (H) 13.0 - 17.0 g/dL   HCT 09.849.1 11.939.0 - 14.752.0 %   MCV 84.7 78.0 - 100.0 fL   MCH 30.5 26.0 - 34.0 pg   MCHC 36.0 30.0 - 36.0 g/dL   RDW 82.912.3 56.211.5 - 13.015.5 %   Platelets 184 150 - 400 K/uL   Neutrophils Relative % 77 %   Neutro Abs 7.9 (H) 1.7 - 7.7 K/uL   Lymphocytes Relative 15 %   Lymphs Abs 1.6 0.7 - 4.0 K/uL   Monocytes Relative 7 %   Monocytes Absolute 0.7  0.1 - 1.0 K/uL   Eosinophils Relative 1 %   Eosinophils Absolute 0.1 0.0 - 0.7 K/uL   Basophils Relative 0 %   Basophils Absolute 0.0 0.0 - 0.1 K/uL  Comprehensive metabolic panel  Result Value Ref Range   Sodium 140 135 - 145 mmol/L   Potassium 3.5 3.5 - 5.1 mmol/L   Chloride 103 101 - 111 mmol/L   CO2 24 22 - 32 mmol/L   Glucose, Bld 206 (H) 65 - 99 mg/dL   BUN 15 6 - 20 mg/dL   Creatinine, Ser 8.651.16 0.61 - 1.24 mg/dL   Calcium 9.5 8.9 - 78.410.3 mg/dL   Total Protein 8.4 (H) 6.5 - 8.1 g/dL   Albumin 4.8 3.5 - 5.0 g/dL   AST 26 15 - 41 U/L   ALT 58 17 - 63 U/L   Alkaline Phosphatase 61 38 - 126 U/L   Total Bilirubin 2.3 (H) 0.3 - 1.2 mg/dL  GFR calc non Af Amer >60 >60 mL/min   GFR calc Af Amer >60 >60 mL/min   Anion gap 13 5 - 15  Lipase, blood  Result Value Ref Range   Lipase 41 11 - 51 U/L   Dg Chest 2 View Result Date: 10/19/2016 CLINICAL DATA:  Epigastric pain radiating to right side since 1500 hours today. Recurrent mid right-sided abdominal pain, nausea and vomiting. EXAM: CHEST  2 VIEW COMPARISON:  Chest x-ray dated 09/13/2014. FINDINGS: Heart size and mediastinal contours are stable. Chronic elevation of the right hemidiaphragm with probable overlying atelectasis/scarring. Lungs otherwise clear. No pleural effusion or pneumothorax seen. Old healed fracture of the right clavicle. Also chronic appearing fractures within the right ribs. No acute or suspicious osseous finding. IMPRESSION: 1. No active cardiopulmonary disease. No evidence of pneumonia or pulmonary edema. 2. Chronic-appearing fractures of the right clavicle and right ribs. Electronically Signed   By: Bary Richard M.D.   On: 10/19/2016 20:06   Ct Renal Stone Study Result Date: 10/19/2016 CLINICAL DATA:  Epigastric pain, radiates to the right side since 1500 hours today. EXAM: CT ABDOMEN AND PELVIS WITHOUT CONTRAST TECHNIQUE: Multidetector CT imaging of the abdomen and pelvis was performed following the standard  protocol without IV contrast. COMPARISON:  None. FINDINGS: Lower chest: Right lower lobe hazy airspace disease likely reflecting atelectasis. Hepatobiliary: No focal liver abnormality is seen. Cholelithiasis without gallbladder wall thickening or biliary dilatation. Gallstones are within the gallbladder neck with possible gallstone in the proximal CBD. Pancreas: Unremarkable. No pancreatic ductal dilatation or surrounding inflammatory changes. Spleen: Normal in size without focal abnormality. Adrenals/Urinary Tract: Bilateral nonobstructing nephrolithiasis. No ureteral calculi. No hydronephrosis. No renal mass. Normal bladder. Stomach/Bowel: Stomach is within normal limits. Appendix appears normal. No evidence of bowel wall thickening, distention, or inflammatory changes. Vascular/Lymphatic: No significant vascular findings are present. No enlarged abdominal or pelvic lymph nodes. Reproductive: Prostate is unremarkable. Other: Right inguinal fat containing hernia. Musculoskeletal: No acute osseous abnormality. No lytic or sclerotic osseous lesion. Multiple healed right rib fractures. IMPRESSION: 1. Bilateral nonobstructing nephrolithiasis. No obstructive uropathy. 2. Cholelithiasis in the gallbladder neck. Possible gallstone in the proximal CBD. 3. Normal appendix. Electronically Signed   By: Elige Ko   On: 10/19/2016 20:05    2025:  Tbili elevated. Pt continues uncomfortable despite pain meds. Will re-dose IV fentanyl. T/C to General Surgeon Dr. Lovell Sheehan, case discussed, including:  HPI, pertinent PM/SHx, VS/PE, dx testing, ED course and treatment:  Agreeable to consult, states pt will need GI MD consult also for possible ERCP, admit to hospitalist service.  T/C to [facility] Dr. Conley Rolls, case discussed, including:  HPI, pertinent PM/SHx, VS/PE, dx testing, ED course and treatment:  Agreeable to admit.    Final Clinical Impressions(s) / ED Diagnoses   Final diagnoses:  None    New Prescriptions New  Prescriptions   No medications on file     Samuel Jester, DO 10/20/16 2134

## 2016-10-19 NOTE — H&P (Signed)
History and Physical    Kyle [name] ZOX:[id number] DOB: 08-08-1970 DOA: 10/19/2016  PCP: [facility]  Patient coming from: Home.    Chief Complaint: abdominal pain.   HPI: Kyle [name] is an 46 y.o. male with hx of HLD and HTN, hx of LBBB and nephrolithiasis, presented to the ER with epigastric and RUQ pain after eating today.  He did not have any fever, chills, black or bloody stool.  Evaluation in the ER showed no leukocytosis, and normal LFTs except for Total bili of 2.3 and slight elevation of BS at 206.  His renal fx test is normal.  An abdominal pelvic CT was done showing non obstructive neprholithiasis and cholelithiasis with possible CBD stone.  There was no evidence of acute cholecystitis.  He was given IVF and IV narcotic, and after speaking with Dr Lovell Sheehan, hospitalist was asked to admit him for further Tx.     ED Course:  See above.  Rewiew of Systems:  Constitutional: Negative for malaise, fever and chills. No significant weight loss or weight gain Eyes: Negative for eye pain, redness and discharge, diplopia, visual changes, or flashes of light. ENMT: Negative for ear pain, hoarseness, nasal congestion, sinus pressure and sore throat. No headaches; tinnitus, drooling, or problem swallowing. Cardiovascular: Negative for chest pain, palpitations, diaphoresis, dyspnea and peripheral edema. ; No orthopnea, PND Respiratory: Negative for cough, hemoptysis, wheezing and stridor. No pleuritic chestpain. Gastrointestinal: Negative for diarrhea, constipation,  melena, blood in stool, hematemesis, jaundice and rectal bleeding.    Genitourinary: Negative for frequency, dysuria, incontinence,flank pain and hematuria; Musculoskeletal: Negative for back pain and neck pain. Negative for swelling and trauma.;  Skin: . Negative for pruritus, rash, abrasions, bruising and skin lesion.; ulcerations Neuro: Negative for headache, lightheadedness and neck stiffness.  Negative for weakness, altered level of consciousness , altered mental status, extremity weakness, burning feet, involuntary movement, seizure and syncope.  Psych: negative for anxiety, depression, insomnia, tearfulness, panic attacks, hallucinations, paranoia, suicidal or homicidal ideation    Past Medical History:  Diagnosis Date  . Hyperlipidemia   . Hypertension   . Kidney stone   . Left bundle branch block     Past Surgical History:  Procedure Laterality Date  . HERNIA REPAIR Right 2014     reports that he has never smoked. He has never used smokeless tobacco. He reports that he drinks alcohol. He reports that he does not use drugs.  Allergies  Allergen Reactions  . Nitroglycerin Other (See Comments)    syncope     Family History  Problem Relation Age of Onset  . Heart attack Father   . Cancer Maternal Aunt        breast     Prior to Admission medications   Medication Sig Start Date End Date Taking? Authorizing Provider  amLODipine (NORVASC) 10 MG tablet Take 1 tablet (10 mg total) by mouth daily. 05/03/16  Yes Jake Bathe, MD  atorvastatin (LIPITOR) 20 MG tablet Take 1 tablet (20 mg total) by mouth daily. 05/03/16  Yes Jake Bathe, MD    Physical Exam: Vitals:   10/19/16 1930 10/19/16 2041 10/19/16 2100 10/19/16 2142  BP:  (!) 163/80 (!) 159/91 (!) 159/91  Pulse: 68 63 79 70  Resp:    18  SpO2: (!) 89% 92% 92% 96%  Weight:       Constitutional: NAD, calm, comfortable Vitals:   10/19/16 1930 10/19/16 2041 10/19/16 2100 10/19/16 2142  BP:  Marland Kitchen)  163/80 (!) 159/91 (!) 159/91  Pulse: 68 63 79 70  Resp:    18  SpO2: (!) 89% 92% 92% 96%  Weight:       Eyes: PERRL, lids and conjunctivae normal ENMT: Mucous membranes are moist. Posterior pharynx clear of any exudate or lesions.Normal dentition.  Neck: normal, supple, no masses, no thyromegaly Respiratory: clear to auscultation bilaterally, no wheezing, no crackles. Normal respiratory effort. No accessory  muscle use.  Cardiovascular: Regular rate and rhythm,  There is a 2/6 SEM at the LSB/ / rubs / gallops. No extremity edema. 2+ pedal pulses. No carotid bruits.  Abdomen: Tenderness over the epigastrium and RUQ pain.   no masses palpated. No hepatosplenomegaly. Bowel sounds positive.  Musculoskeletal: no clubbing / cyanosis. No joint deformity upper and lower extremities. Good ROM, no contractures. Normal muscle tone.  Skin: no rashes, lesions, ulcers. No induration Neurologic: CN 2-12 grossly intact. Sensation intact, DTR normal. Strength 5/5 in all 4.  Psychiatric: Normal judgment and insight. Alert and oriented x 3. Normal mood.   Labs on Admission: I have personally reviewed following labs and imaging studies  CBC:  Recent Labs Lab 10/19/16 1906  WBC 10.4  NEUTROABS 7.9*  HGB 17.7*  HCT 49.1  MCV 84.7  PLT 184   Basic Metabolic Panel:  Recent Labs Lab 10/19/16 1906  NA 140  K 3.5  CL 103  CO2 24  GLUCOSE 206*  BUN 15  CREATININE 1.16  CALCIUM 9.5   GFR: Estimated Creatinine Clearance: 83.2 mL/min (by C-G formula based on SCr of 1.16 mg/dL). Liver Function Tests:  Recent Labs Lab 10/19/16 1906  AST 26  ALT 58  ALKPHOS 61  BILITOT 2.3*  PROT 8.4*  ALBUMIN 4.8    Recent Labs Lab 10/19/16 1906  LIPASE 41   Radiological Exams on Admission: Dg Chest 2 View  Result Date: 10/19/2016 CLINICAL DATA:  Epigastric pain radiating to right side since 1500 hours today. Recurrent mid right-sided abdominal pain, nausea and vomiting. EXAM: CHEST  2 VIEW COMPARISON:  Chest x-ray dated 09/13/2014. FINDINGS: Heart size and mediastinal contours are stable. Chronic elevation of the right hemidiaphragm with probable overlying atelectasis/scarring. Lungs otherwise clear. No pleural effusion or pneumothorax seen. Old healed fracture of the right clavicle. Also chronic appearing fractures within the right ribs. No acute or suspicious osseous finding. IMPRESSION: 1. No active  cardiopulmonary disease. No evidence of pneumonia or pulmonary edema. 2. Chronic-appearing fractures of the right clavicle and right ribs. Electronically Signed   By: Bary RichardStan  Maynard M.D.   On: 10/19/2016 20:06   Ct Renal Stone Study  Result Date: 10/19/2016 CLINICAL DATA:  Epigastric pain, radiates to the right side since 1500 hours today. EXAM: CT ABDOMEN AND PELVIS WITHOUT CONTRAST TECHNIQUE: Multidetector CT imaging of the abdomen and pelvis was performed following the standard protocol without IV contrast. COMPARISON:  None. FINDINGS: Lower chest: Right lower lobe hazy airspace disease likely reflecting atelectasis. Hepatobiliary: No focal liver abnormality is seen. Cholelithiasis without gallbladder wall thickening or biliary dilatation. Gallstones are within the gallbladder neck with possible gallstone in the proximal CBD. Pancreas: Unremarkable. No pancreatic ductal dilatation or surrounding inflammatory changes. Spleen: Normal in size without focal abnormality. Adrenals/Urinary Tract: Bilateral nonobstructing nephrolithiasis. No ureteral calculi. No hydronephrosis. No renal mass. Normal bladder. Stomach/Bowel: Stomach is within normal limits. Appendix appears normal. No evidence of bowel wall thickening, distention, or inflammatory changes. Vascular/Lymphatic: No significant vascular findings are present. No enlarged abdominal or pelvic lymph nodes. Reproductive: Prostate  is unremarkable. Other: Right inguinal fat containing hernia. Musculoskeletal: No acute osseous abnormality. No lytic or sclerotic osseous lesion. Multiple healed right rib fractures. IMPRESSION: 1. Bilateral nonobstructing nephrolithiasis. No obstructive uropathy. 2. Cholelithiasis in the gallbladder neck. Possible gallstone in the proximal CBD. 3. Normal appendix. Electronically Signed   By: Elige Ko   On: 10/19/2016 20:05   Assessment/Plan Active Problems:   Essential hypertension   LBBB (left bundle branch block)    Hyperlipemia   Choledocholithiasis   Heart murmur   PLAN:   Cholelithiasis and choledocholithiasis with no acute cholecystitis:   Will admit, give IVF, NPO, and Tx symptoms.  Will consult GI for consideration of ERCP.  He is feeling better now.  Heart murmur:  Will obtain ECHO.  Characteristic of murmur is benign.  HTN:  BP is slightly elevated.  Likely due to pain.  Will continue with Norvasc.  HLD:  Will continue with statin.   DVT prophylaxis: Lovenox.  Code Status: FULL CODE.. Family Communication:  Wife Clydie Braun and son at bedside.  Disposition Plan: home.  Consults called: Surgery, Dr Lovell Sheehan.  Admission status: inpatient.    Jakeline Dave MD FACP. [facility]  If 7PM-7AM, please contact night-coverage [email or web address] Password [facility]  10/19/2016, 10:17 PM

## 2016-10-20 ENCOUNTER — Inpatient Hospital Stay (HOSPITAL_COMMUNITY): Payer: Federal, State, Local not specified - PPO

## 2016-10-20 DIAGNOSIS — R1011 Right upper quadrant pain: Secondary | ICD-10-CM

## 2016-10-20 DIAGNOSIS — K8050 Calculus of bile duct without cholangitis or cholecystitis without obstruction: Secondary | ICD-10-CM

## 2016-10-20 DIAGNOSIS — R17 Unspecified jaundice: Secondary | ICD-10-CM

## 2016-10-20 DIAGNOSIS — R1013 Epigastric pain: Secondary | ICD-10-CM

## 2016-10-20 DIAGNOSIS — E784 Other hyperlipidemia: Secondary | ICD-10-CM

## 2016-10-20 DIAGNOSIS — I447 Left bundle-branch block, unspecified: Secondary | ICD-10-CM

## 2016-10-20 DIAGNOSIS — R011 Cardiac murmur, unspecified: Secondary | ICD-10-CM

## 2016-10-20 LAB — CBC
HCT: 45.5 % (ref 39.0–52.0)
Hemoglobin: 15.8 g/dL (ref 13.0–17.0)
MCH: 29.8 pg (ref 26.0–34.0)
MCHC: 34.7 g/dL (ref 30.0–36.0)
MCV: 85.8 fL (ref 78.0–100.0)
PLATELETS: 165 10*3/uL (ref 150–400)
RBC: 5.3 MIL/uL (ref 4.22–5.81)
RDW: 12.6 % (ref 11.5–15.5)
WBC: 8 10*3/uL (ref 4.0–10.5)

## 2016-10-20 LAB — COMPREHENSIVE METABOLIC PANEL
ALT: 42 U/L (ref 17–63)
AST: 16 U/L (ref 15–41)
Albumin: 3.8 g/dL (ref 3.5–5.0)
Alkaline Phosphatase: 48 U/L (ref 38–126)
Anion gap: 8 (ref 5–15)
BILIRUBIN TOTAL: 2.4 mg/dL — AB (ref 0.3–1.2)
BUN: 10 mg/dL (ref 6–20)
CO2: 27 mmol/L (ref 22–32)
CREATININE: 0.95 mg/dL (ref 0.61–1.24)
Calcium: 8.5 mg/dL — ABNORMAL LOW (ref 8.9–10.3)
Chloride: 102 mmol/L (ref 101–111)
Glucose, Bld: 137 mg/dL — ABNORMAL HIGH (ref 65–99)
Potassium: 3.7 mmol/L (ref 3.5–5.1)
Sodium: 137 mmol/L (ref 135–145)
TOTAL PROTEIN: 6.8 g/dL (ref 6.5–8.1)

## 2016-10-20 LAB — ECHOCARDIOGRAM COMPLETE
HEIGHTINCHES: 68 in
WEIGHTICAEL: 2356.28 [oz_av]

## 2016-10-20 MED ORDER — SODIUM CHLORIDE 0.9 % IV SOLN: INTRAVENOUS | Status: DC

## 2016-10-20 MED ORDER — SODIUM CHLORIDE 0.9 % IV SOLN: Freq: Once | INTRAVENOUS | Status: DC

## 2016-10-20 MED ORDER — POTASSIUM CHLORIDE 10 MEQ/100ML IV SOLN: 10.0000 meq | INTRAVENOUS | Status: DC

## 2016-10-20 MED ORDER — POTASSIUM CHLORIDE IN NACL 20-0.9 MEQ/L-% IV SOLN
INTRAVENOUS | Status: DC
  Administered 2016-10-20 – 2016-10-21 (×2): via INTRAVENOUS

## 2016-10-20 MED ORDER — HYDRALAZINE HCL 20 MG/ML IJ SOLN: 10.0000 mg | Freq: Four times a day (QID) | INTRAMUSCULAR | Status: DC | PRN

## 2016-10-20 NOTE — Consult Note (Signed)
Reason for Consult: Cholecystitis, cholelithiasis, possible choledocholithiasis Referring Physician: Dr. Evelena Leyden is an 46 y.o. male.  HPI: Patient is a 46 year old white male who presented with acute onset of right upper quadrant abdominal pain and nausea. He presented to the emergency room for further evaluation treatment. CT scan of the abdomen revealed cholelithiasis with possible choledocholithiasis in the proximal common bile duct. Liver enzyme tests were remarkable for an elevated total bilirubin. His liver enzyme tests were within normal limits. Today, he states his pain is gone. He states he has had a history of intermittent right upper quadrant abdominal pain and an upset stomach. This did not seem to be associated with fatty foods. No fever, chills, jaundice have been noted. He was told the past that he did have a slightly elevated total bilirubin.  Past Medical History:  Diagnosis Date  . Hyperlipidemia   . Hypertension   . Kidney stone   . Left bundle branch block     Past Surgical History:  Procedure Laterality Date  . HERNIA REPAIR Right 2014    Family History  Problem Relation Age of Onset  . Heart attack Father   . Cancer Maternal Aunt        breast    Social History:  reports that he has never smoked. He has never used smokeless tobacco. He reports that he drinks alcohol. He reports that he does not use drugs.  Allergies:  Allergies  Allergen Reactions  . Nitroglycerin Other (See Comments)    syncope     Medications:  Scheduled: . amLODipine  10 mg Oral Daily  . atorvastatin  20 mg Oral Daily  . enoxaparin (LOVENOX) injection  40 mg Subcutaneous Q24H    Results for orders placed or performed during the hospital encounter of 10/19/16 (from the past 48 hour(s))  CBC with Differential     Status: Abnormal   Collection Time: 10/19/16  7:06 PM  Result Value Ref Range   WBC 10.4 4.0 - 10.5 K/uL   RBC 5.80 4.22 - 5.81 MIL/uL   Hemoglobin 17.7  (H) 13.0 - 17.0 g/dL   HCT 49.1 39.0 - 52.0 %   MCV 84.7 78.0 - 100.0 fL   MCH 30.5 26.0 - 34.0 pg   MCHC 36.0 30.0 - 36.0 g/dL   RDW 12.3 11.5 - 15.5 %   Platelets 184 150 - 400 K/uL   Neutrophils Relative % 77 %   Neutro Abs 7.9 (H) 1.7 - 7.7 K/uL   Lymphocytes Relative 15 %   Lymphs Abs 1.6 0.7 - 4.0 K/uL   Monocytes Relative 7 %   Monocytes Absolute 0.7 0.1 - 1.0 K/uL   Eosinophils Relative 1 %   Eosinophils Absolute 0.1 0.0 - 0.7 K/uL   Basophils Relative 0 %   Basophils Absolute 0.0 0.0 - 0.1 K/uL  Comprehensive metabolic panel     Status: Abnormal   Collection Time: 10/19/16  7:06 PM  Result Value Ref Range   Sodium 140 135 - 145 mmol/L   Potassium 3.5 3.5 - 5.1 mmol/L   Chloride 103 101 - 111 mmol/L   CO2 24 22 - 32 mmol/L   Glucose, Bld 206 (H) 65 - 99 mg/dL   BUN 15 6 - 20 mg/dL   Creatinine, Ser 1.16 0.61 - 1.24 mg/dL   Calcium 9.5 8.9 - 10.3 mg/dL   Total Protein 8.4 (H) 6.5 - 8.1 g/dL   Albumin 4.8 3.5 - 5.0 g/dL   AST  26 15 - 41 U/L   ALT 58 17 - 63 U/L   Alkaline Phosphatase 61 38 - 126 U/L   Total Bilirubin 2.3 (H) 0.3 - 1.2 mg/dL   GFR calc non Af Amer >60 >60 mL/min   GFR calc Af Amer >60 >60 mL/min    Comment: (NOTE) The eGFR has been calculated using the CKD EPI equation. This calculation has not been validated in all clinical situations. eGFR's persistently <60 mL/min signify possible Chronic Kidney Disease.    Anion gap 13 5 - 15  Lipase, blood     Status: None   Collection Time: 10/19/16  7:06 PM  Result Value Ref Range   Lipase 41 11 - 51 U/L  Urinalysis, Routine w reflex microscopic     Status: Abnormal   Collection Time: 10/19/16  9:53 PM  Result Value Ref Range   Color, Urine YELLOW YELLOW   APPearance CLEAR CLEAR   Specific Gravity, Urine 1.013 1.005 - 1.030   pH 6.0 5.0 - 8.0   Glucose, UA 150 (A) NEGATIVE mg/dL   Hgb urine dipstick NEGATIVE NEGATIVE   Bilirubin Urine NEGATIVE NEGATIVE   Ketones, ur NEGATIVE NEGATIVE mg/dL    Protein, ur NEGATIVE NEGATIVE mg/dL   Nitrite NEGATIVE NEGATIVE   Leukocytes, UA NEGATIVE NEGATIVE  Comprehensive metabolic panel     Status: Abnormal   Collection Time: 10/20/16  6:24 AM  Result Value Ref Range   Sodium 137 135 - 145 mmol/L   Potassium 3.7 3.5 - 5.1 mmol/L   Chloride 102 101 - 111 mmol/L   CO2 27 22 - 32 mmol/L   Glucose, Bld 137 (H) 65 - 99 mg/dL   BUN 10 6 - 20 mg/dL   Creatinine, Ser 0.95 0.61 - 1.24 mg/dL   Calcium 8.5 (L) 8.9 - 10.3 mg/dL   Total Protein 6.8 6.5 - 8.1 g/dL   Albumin 3.8 3.5 - 5.0 g/dL   AST 16 15 - 41 U/L   ALT 42 17 - 63 U/L   Alkaline Phosphatase 48 38 - 126 U/L   Total Bilirubin 2.4 (H) 0.3 - 1.2 mg/dL   GFR calc non Af Amer >60 >60 mL/min   GFR calc Af Amer >60 >60 mL/min    Comment: (NOTE) The eGFR has been calculated using the CKD EPI equation. This calculation has not been validated in all clinical situations. eGFR's persistently <60 mL/min signify possible Chronic Kidney Disease.    Anion gap 8 5 - 15  CBC     Status: None   Collection Time: 10/20/16  6:24 AM  Result Value Ref Range   WBC 8.0 4.0 - 10.5 K/uL   RBC 5.30 4.22 - 5.81 MIL/uL   Hemoglobin 15.8 13.0 - 17.0 g/dL   HCT 45.5 39.0 - 52.0 %   MCV 85.8 78.0 - 100.0 fL   MCH 29.8 26.0 - 34.0 pg   MCHC 34.7 30.0 - 36.0 g/dL   RDW 12.6 11.5 - 15.5 %   Platelets 165 150 - 400 K/uL    Dg Chest 2 View  Result Date: 10/19/2016 CLINICAL DATA:  Epigastric pain radiating to right side since 1500 hours today. Recurrent mid right-sided abdominal pain, nausea and vomiting. EXAM: CHEST  2 VIEW COMPARISON:  Chest x-ray dated 09/13/2014. FINDINGS: Heart size and mediastinal contours are stable. Chronic elevation of the right hemidiaphragm with probable overlying atelectasis/scarring. Lungs otherwise clear. No pleural effusion or pneumothorax seen. Old healed fracture of the right clavicle. Also chronic  appearing fractures within the right ribs. No acute or suspicious osseous finding.  IMPRESSION: 1. No active cardiopulmonary disease. No evidence of pneumonia or pulmonary edema. 2. Chronic-appearing fractures of the right clavicle and right ribs. Electronically Signed   By: Franki Cabot M.D.   On: 10/19/2016 20:06   Ct Renal Stone Study  Result Date: 10/19/2016 CLINICAL DATA:  Epigastric pain, radiates to the right side since 1500 hours today. EXAM: CT ABDOMEN AND PELVIS WITHOUT CONTRAST TECHNIQUE: Multidetector CT imaging of the abdomen and pelvis was performed following the standard protocol without IV contrast. COMPARISON:  None. FINDINGS: Lower chest: Right lower lobe hazy airspace disease likely reflecting atelectasis. Hepatobiliary: No focal liver abnormality is seen. Cholelithiasis without gallbladder wall thickening or biliary dilatation. Gallstones are within the gallbladder neck with possible gallstone in the proximal CBD. Pancreas: Unremarkable. No pancreatic ductal dilatation or surrounding inflammatory changes. Spleen: Normal in size without focal abnormality. Adrenals/Urinary Tract: Bilateral nonobstructing nephrolithiasis. No ureteral calculi. No hydronephrosis. No renal mass. Normal bladder. Stomach/Bowel: Stomach is within normal limits. Appendix appears normal. No evidence of bowel wall thickening, distention, or inflammatory changes. Vascular/Lymphatic: No significant vascular findings are present. No enlarged abdominal or pelvic lymph nodes. Reproductive: Prostate is unremarkable. Other: Right inguinal fat containing hernia. Musculoskeletal: No acute osseous abnormality. No lytic or sclerotic osseous lesion. Multiple healed right rib fractures. IMPRESSION: 1. Bilateral nonobstructing nephrolithiasis. No obstructive uropathy. 2. Cholelithiasis in the gallbladder neck. Possible gallstone in the proximal CBD. 3. Normal appendix. Electronically Signed   By: Kathreen Devoid   On: 10/19/2016 20:05    ROS:  Pertinent items noted in HPI and remainder of comprehensive ROS  otherwise negative.  Blood pressure (!) 145/77, pulse 78, temperature 98.4 F (36.9 C), temperature source Oral, resp. rate 18, height _0  (1.727 m), weight 147 lb 4.3 oz (66.8 kg), SpO2 95 %. Physical Exam: Pleasant well-developed well-nourished white male no acute distress. Head is normocephalic, atraumatic Eyes without scleral icterus. Neck is supple without lymphadenopathy Lungs clear auscultation with breath sounds bilaterally. Heart examination reveals a regular rate and rhythm without S3, S4, murmurs. Abdomen is soft, nontender, nondistended. No hepatosplenomegaly, masses, hernias identified. CT scan report reviewed.  Assessment/Plan: Impression: Biliary colic secondary to cholelithiasis, question choledocholithiasis History of left bundle branch block Plan: Patient has undergone 2-D echo. He is currently undergoing ultrasound of the right upper quadrant. Awaiting GI consultation. May need MRCP prior to any further surgical intervention.  Aviva Signs 10/20/2016, 10:20 AM

## 2016-10-20 NOTE — Progress Notes (Signed)
PROGRESS NOTE  Kyle [name] ZOX:[id number] DOB: [birth year] DOA: 10/19/2016 PCP: [facility]  Brief History:  46 year old male with a history of hypertension, hyperlipidemia, left bundle branch block presented with 1 day history of worsening epigastric and right upper quadrant pain. The patient states that he has had 2 prior episodes within the last month of similar epigastric abdominal pain radiating to RUQ that was self-limiting. However, his episode yesterday was worse than usual causing him to seek medical attention. He is one episode of emesis prior to admission. He cannot recall any clear precipitant to his previous abdominal pain. He denies any fevers, chills, chest pain, shortness breath, coughing, hemoptysis, diarrhea, dysuria, hematuria. He denies any new medications. Evaluation in the emergency department including CT of the abdomen with renal stone protocol revealed cholelithiasis with gallstones in the gallbladder neck and possible stone in the proximal CBD. Gen. surgery and GI were consulted to assist in management.  Assessment/Plan: Symptomatically Cholelithiasis with possible choledocholithiasis -consult GI -General surgery consult -presently pain free -NPO until seen by GI/general surgery -10/19/2016 CT renal stone protocol--gallstones in the gallbladder neck with possible stone in the proximal CBD; nonobstructive nephrolithiasis -Continue IV fluids--add KCL to fluid  Hypertension -Holding amlodipine until able to take PO -hydralazine prn SBP >180  Hyperlipidemia -holding lipitor until able to take PO  LBBB -stress test 2011 reassuring -stable/chronic, seen by cardiology in past    Disposition Plan:   Home in 2-3 days  Family Communication:   Spouse updated at bedside--Total time spent 35 minutes.  Greater than 50% spent face to face counseling and coordinating care.   Consultants:  GI/general surgery  Code Status:  FULL    DVT Prophylaxis:  [place] Lovenox   Procedures: As Listed in Progress Note Above  Antibiotics: None    Subjective: Patient denies fevers, chills, headache, chest pain, dyspnea, nausea, vomiting, diarrhea, abdominal pain, dysuria, hematuria, hematochezia, and melena.   Objective: Vitals:   10/19/16 2100 10/19/16 2142 10/19/16 2231 10/20/16 0651  BP: (!) 159/91 (!) 159/91 (!) 160/75 (!) 145/77  Pulse: 79 70 65 78  Resp:  18 18 18   Temp:   97.6 F (36.4 C) 98.4 F (36.9 C)  TempSrc:   Oral Oral  SpO2: 92% 96% 96% 95%  Weight:   66.8 kg (147 lb 4.3 oz)   Height:   5\' 8"  (1.727 m)     Intake/Output Summary (Last 24 hours) at 10/20/16 0844 Last data filed at 10/20/16 0651  Gross per 24 hour  Intake          2016.67 ml  Output              700 ml  Net          1316.67 ml   Weight change:  Exam:   General:  Pt is alert, follows commands appropriately, not in acute distress  HEENT: No icterus, No thrush, No neck mass, [place]/AT  Cardiovascular: RRR, S1/S2, no rubs, no gallops  Respiratory: CTA bilaterally, no wheezing, no crackles, no rhonchi  Abdomen: Soft/+BS, non tender, non distended, no guarding  Extremities: No edema, No lymphangitis, No petechiae, No rashes, no synovitis   Data Reviewed: I have personally reviewed following labs and imaging studies Basic Metabolic Panel:  Recent Labs Lab 10/19/16 1906 10/20/16 0624  NA 140 137  K 3.5 3.7  CL 103 102  CO2 24 27  GLUCOSE 206* 137*  BUN  15 10  CREATININE 1.16 0.95  CALCIUM 9.5 8.5*   Liver Function Tests:  Recent Labs Lab 10/19/16 1906 10/20/16 0624  AST 26 16  ALT 58 42  ALKPHOS 61 48  BILITOT 2.3* 2.4*  PROT 8.4* 6.8  ALBUMIN 4.8 3.8    Recent Labs Lab 10/19/16 1906  LIPASE 41   No results for input(s): AMMONIA in the last 168 hours. Coagulation Profile: No results for input(s): INR, PROTIME in the last 168 hours. CBC:  Recent Labs Lab 10/19/16 1906 10/20/16 0624  WBC 10.4 8.0   NEUTROABS 7.9*  --   HGB 17.7* 15.8  HCT 49.1 45.5  MCV 84.7 85.8  PLT 184 165   Cardiac Enzymes: No results for input(s): CKTOTAL, CKMB, CKMBINDEX, TROPONINI in the last 168 hours. BNP: Invalid input(s): POCBNP CBG: No results for input(s): GLUCAP in the last 168 hours. HbA1C: No results for input(s): HGBA1C in the last 72 hours. Urine analysis:    Component Value Date/Time   COLORURINE YELLOW 10/19/2016 2153   APPEARANCEUR CLEAR 10/19/2016 2153   LABSPEC 1.013 10/19/2016 2153   PHURINE 6.0 10/19/2016 2153   GLUCOSEU 150 (A) 10/19/2016 2153   HGBUR NEGATIVE 10/19/2016 2153   BILIRUBINUR NEGATIVE 10/19/2016 2153   KETONESUR NEGATIVE 10/19/2016 2153   PROTEINUR NEGATIVE 10/19/2016 2153   NITRITE NEGATIVE 10/19/2016 2153   LEUKOCYTESUR NEGATIVE 10/19/2016 2153   Sepsis Labs: @LABRCNTIP (procalcitonin:4,lacticidven:4) )No results found for this or any previous visit (from the past 240 hour(s)).   Scheduled Meds: . amLODipine  10 mg Oral Daily  . atorvastatin  20 mg Oral Daily  . enoxaparin (LOVENOX) injection  40 mg Subcutaneous Q24H   Continuous Infusions: . dextrose 5 % and 0.9% NaCl 125 mL/hr at 10/19/16 2304    Procedures/Studies: Dg Chest 2 View  Result Date: 10/19/2016 CLINICAL DATA:  Epigastric pain radiating to right side since 1500 hours today. Recurrent mid right-sided abdominal pain, nausea and vomiting. EXAM: CHEST  2 VIEW COMPARISON:  Chest x-ray dated 09/13/2014. FINDINGS: Heart size and mediastinal contours are stable. Chronic elevation of the right hemidiaphragm with probable overlying atelectasis/scarring. Lungs otherwise clear. No pleural effusion or pneumothorax seen. Old healed fracture of the right clavicle. Also chronic appearing fractures within the right ribs. No acute or suspicious osseous finding. IMPRESSION: 1. No active cardiopulmonary disease. No evidence of pneumonia or pulmonary edema. 2. Chronic-appearing fractures of the right clavicle and  right ribs. Electronically Signed   By: Bary Richard M.D.   On: 10/19/2016 20:06   Ct Renal Stone Study  Result Date: 10/19/2016 CLINICAL DATA:  Epigastric pain, radiates to the right side since 1500 hours today. EXAM: CT ABDOMEN AND PELVIS WITHOUT CONTRAST TECHNIQUE: Multidetector CT imaging of the abdomen and pelvis was performed following the standard protocol without IV contrast. COMPARISON:  None. FINDINGS: Lower chest: Right lower lobe hazy airspace disease likely reflecting atelectasis. Hepatobiliary: No focal liver abnormality is seen. Cholelithiasis without gallbladder wall thickening or biliary dilatation. Gallstones are within the gallbladder neck with possible gallstone in the proximal CBD. Pancreas: Unremarkable. No pancreatic ductal dilatation or surrounding inflammatory changes. Spleen: Normal in size without focal abnormality. Adrenals/Urinary Tract: Bilateral nonobstructing nephrolithiasis. No ureteral calculi. No hydronephrosis. No renal mass. Normal bladder. Stomach/Bowel: Stomach is within normal limits. Appendix appears normal. No evidence of bowel wall thickening, distention, or inflammatory changes. Vascular/Lymphatic: No significant vascular findings are present. No enlarged abdominal or pelvic lymph nodes. Reproductive: Prostate is unremarkable. Other: Right inguinal fat containing hernia. Musculoskeletal: No  acute osseous abnormality. No lytic or sclerotic osseous lesion. Multiple healed right rib fractures. IMPRESSION: 1. Bilateral nonobstructing nephrolithiasis. No obstructive uropathy. 2. Cholelithiasis in the gallbladder neck. Possible gallstone in the proximal CBD. 3. Normal appendix. Electronically Signed   By: Elige KoHetal  Patel   On: 10/19/2016 20:05    Orrie Schubert, DO  [facility] Pager [phone]  If 7PM-7AM, please contact night-coverage [email or web address] Password [email or web address] 10/20/2016, 8:44 AM   LOS: 1 day

## 2016-10-20 NOTE — Consult Note (Signed)
Referring Provider: No ref. provider found Primary Care Physician:  Trey SailorsPa, [facility] Primary Gastroenterologist:  Dr. Dulce Sellarutlaw  Reason for Consultation:  Possible common duct stone and question need for ERCP  HPI: Very pleasant 46 year old gentleman from [place] admitted to the hospital overnight after experiencing a bout of crampy epigastric right upper quadrant abdominal pain.  Presented to the ED here. Noncontrast CT demonstrated cholelithiasis without evidence of cholecystitis. Question of a stone in the proximal common bile duct. Bile duct not dilated. Nonobstructing renal calculi as well.  White bood cell count 10.4 admission; 8.0 this morning.  LFTs look good except for total bilirubin at 2.4 (no fractionation done).  Overnight, patient states his pain has resolved. He feels good this morning. He is hungry. Past GI history significant for irritable bowel syndrome for which he has seen Dr. Dulce Sellarutlaw in the past.  On no specific therapy at this time.  Patient denies chronic symptoms of reflux, odynophagia ,dysphagia or early satiety, nausea or vomiting. Denies constipation, diarrhea, melena or rectal bleeding.  Patient and wife both state that he's been told on multiple occasions throughout his life he has had a mild "elevation in his liver numbers".  They recall nothing more specific.  Past Medical History:  Diagnosis Date  . Hyperlipidemia   . Hypertension   . Kidney stone   . Left bundle branch block    LF Past Surgical History:  Procedure Laterality Date  . HERNIA REPAIR Right 2014    Prior to Admission medications   Medication Sig Start Date End Date Taking? Authorizing Provider  amLODipine (NORVASC) 10 MG tablet Take 1 tablet (10 mg total) by mouth daily. 05/03/16  Yes Jake BatheSkains, Mark C, MD  atorvastatin (LIPITOR) 20 MG tablet Take 1 tablet (20 mg total) by mouth daily. 05/03/16  Yes Jake BatheSkains, Mark C, MD    Current Facility-Administered Medications    Medication Dose Route Frequency Provider Last Rate Last Dose  . 0.9 %  sodium chloride infusion   Intravenous Once Franky MachoJenkins, Mark, MD      . 0.9 % NaCl with KCl 20 mEq/ L  infusion   Intravenous Continuous Houston SirenLe, Peter, MD      . acetaminophen (TYLENOL) tablet 650 mg  650 mg Oral Q6H PRN [place] SirenLe, Peter, MD       Or  . acetaminophen (TYLENOL) suppository 650 mg  650 mg Rectal Q6H PRN [place] SirenLe, Peter, MD      . amLODipine (NORVASC) tablet 10 mg  10 mg Oral Daily Houston SirenLe, Peter, MD   10 mg at 10/19/16 2347  . atorvastatin (LIPITOR) tablet 20 mg  20 mg Oral Daily Houston SirenLe, Peter, MD   20 mg at 10/19/16 2347  . enoxaparin (LOVENOX) injection 40 mg  40 mg Subcutaneous Q24H Houston SirenLe, Peter, MD      . fentaNYL (SUBLIMAZE) injection 50 mcg  50 mcg Intravenous Q30 min PRN Samuel JesterMcManus, Kathleen, DO   50 mcg at 10/19/16 1924  . fentaNYL (SUBLIMAZE) injection 50 mcg  50 mcg Intravenous Q2H PRN [place] SirenLe, Peter, MD      . hydrALAZINE (APRESOLINE) injection 10 mg  10 mg Intravenous Q6H PRN Tat, Onalee Huaavid, MD      . ondansetron [facility](ZOFRAN) tablet 4 mg  4 mg Oral Q6H PRN [place] SirenLe, Peter, MD       Or  . ondansetron [facility](ZOFRAN) injection 4 mg  4 mg Intravenous Q6H PRN [place] SirenLe, Peter, MD        Allergies as of 10/19/2016 -  Review Complete 10/19/2016  Allergen Reaction Noted  . Nitroglycerin Other (See Comments) 02/08/2013    Family History  Problem Relation Age of Onset  . Heart attack Father   . Cancer Maternal Aunt        breast    Social History   Social History  . Marital status: Married    Spouse name: N/A  . Number of children: N/A  . Years of education: N/A   Occupational History  . Not on file.   Social History Main Topics  . Smoking status: Never Smoker  . Smokeless tobacco: Never Used  . Alcohol use Yes     Comment: rare  . Drug use: No  . Sexual activity: Not on file   Other Topics Concern  . Not on file   Social History Narrative  . No narrative on file    Review of Systems:  As in history of  present illness.  Physical  Exam: Vital signs in last 24 hours: Temp:  [97.6 F (36.4 C)-98.4 F (36.9 C)] 98.4 F (36.9 C) (05/27 0651) Pulse Rate:  [63-79] 78 (05/27 0651) Resp:  [18-20] 18 (05/27 0651) BP: (145-166)/(75-104) 145/77 (05/27 0651) SpO2:  [89 %-100 %] 95 % (05/27 0651) Weight:  [147 lb 4.3 oz (66.8 kg)-190 lb (86.2 kg)] 147 lb 4.3 oz (66.8 kg) (05/26 2231) Last BM Date: 10/19/16 General:   Alert,  Well-developed, well-nourished, pleasant and cooperative in NAD Eyes:  Sclera clear, no icterus.   Conjunctiva pink. Lungs:  Clear throughout to auscultation.   No wheezes, crackles, or rhonchi. No acute distress. Heart:  Regular rate and rhythm; no murmurs, clicks, rubs,  or gallops. Abdomen:  Soft, nontender and nondistended. No masses, hepatosplenomegaly or hernias noted. Normal bowel sounds, without guarding, and without rebound.    Intake/Output from previous day: 05/26 0701 - 05/27 0700 In: 2016.7 [I.V.:966.7; IV Piggyback:1050] Out: 700 [Urine:700] Intake/Output this shift: Total I/O In: 0  Out: 600 [Urine:600]  Lab Results:  Recent Labs  10/19/16 1906 10/20/16 0624  WBC 10.4 8.0  HGB 17.7* 15.8  HCT 49.1 45.5  PLT 184 165   BMET  Recent Labs  10/19/16 1906 10/20/16 0624  NA 140 137  K 3.5 3.7  CL 103 102  CO2 24 27  GLUCOSE 206* 137*  BUN 15 10  CREATININE 1.16 0.95  CALCIUM 9.5 8.5*   LFT  Recent Labs  10/20/16 0624  PROT 6.8  ALBUMIN 3.8  AST 16  ALT 42  ALKPHOS 48  BILITOT 2.4*   PT/INR No results for input(s): LABPROT, INR in the last 72 hours. Hepatitis Panel No results for input(s): HEPBSAG, HCVAB, HEPAIGM, HEPBIGM in the last 72 hours. C-Diff No results for input(s): CDIFFTOX in the last 72 hours.  Studies/Results: Dg Chest 2 View  Result Date: 10/19/2016 CLINICAL DATA:  Epigastric pain radiating to right side since 1500 hours today. Recurrent mid right-sided abdominal pain, nausea and vomiting. EXAM: CHEST  2 VIEW COMPARISON:  Chest x-ray dated  09/13/2014. FINDINGS: Heart size and mediastinal contours are stable. Chronic elevation of the right hemidiaphragm with probable overlying atelectasis/scarring. Lungs otherwise clear. No pleural effusion or pneumothorax seen. Old healed fracture of the right clavicle. Also chronic appearing fractures within the right ribs. No acute or suspicious osseous finding. IMPRESSION: 1. No active cardiopulmonary disease. No evidence of pneumonia or pulmonary edema. 2. Chronic-appearing fractures of the right clavicle and right ribs. Electronically Signed   By: Anne Ng.D.  On: 10/19/2016 20:06   Ct Renal Stone Study  Result Date: 10/19/2016 CLINICAL DATA:  Epigastric pain, radiates to the right side since 1500 hours today. EXAM: CT ABDOMEN AND PELVIS WITHOUT CONTRAST TECHNIQUE: Multidetector CT imaging of the abdomen and pelvis was performed following the standard protocol without IV contrast. COMPARISON:  None. FINDINGS: Lower chest: Right lower lobe hazy airspace disease likely reflecting atelectasis. Hepatobiliary: No focal liver abnormality is seen. Cholelithiasis without gallbladder wall thickening or biliary dilatation. Gallstones are within the gallbladder neck with possible gallstone in the proximal CBD. Pancreas: Unremarkable. No pancreatic ductal dilatation or surrounding inflammatory changes. Spleen: Normal in size without focal abnormality. Adrenals/Urinary Tract: Bilateral nonobstructing nephrolithiasis. No ureteral calculi. No hydronephrosis. No renal mass. Normal bladder. Stomach/Bowel: Stomach is within normal limits. Appendix appears normal. No evidence of bowel wall thickening, distention, or inflammatory changes. Vascular/Lymphatic: No significant vascular findings are present. No enlarged abdominal or pelvic lymph nodes. Reproductive: Prostate is unremarkable. Other: Right inguinal fat containing hernia. Musculoskeletal: No acute osseous abnormality. No lytic or sclerotic osseous lesion.  Multiple healed right rib fractures. IMPRESSION: 1. Bilateral nonobstructing nephrolithiasis. No obstructive uropathy. 2. Cholelithiasis in the gallbladder neck. Possible gallstone in the proximal CBD. 3. Normal appendix. Electronically Signed   By: Elige Ko   On: 10/19/2016 20:05    Impression:  Very pleasant 46 year old gentleman admitted to hospital with a self-limiting bout of epigastric and right upper quadrant abdominal pain.  Cholelithiasis seen on CT without evidence of cholecystitis. There is a question of a possible calculus in the proximal common bile duct.  After reviewing the films, I feel this may or may not be the case.  Mildly elevated total bilirubin without fractionation. History of liver numbers being off throughout his life by report. This may be nothing more than Gilbert's syndrome.  I do believe he has experienced an acute bout of biliary colic in the setting of cholelithiasis. No compelling evidence for extra hepatic biliary obstruction or choledocholithiasis otherwise.  Right upper quadrant ultrasound is pending.  No need for ERCP at this time although I do think patient would benefit from use gallbladder out in the near future.  MRCP would be useful to further evaluate for a possible common duct stone.  Recommendations:    Supportive measures. Follow up on ultrasound. Fractionate bilirubin. Further recommendations to follow.        Notice:  This dictation was prepared with Dragon dictation along with smaller phrase technology. Any transcriptional errors that result from this process are unintentional and may not be corrected upon review.

## 2016-10-20 NOTE — Progress Notes (Signed)
*  PRELIMINARY RESULTS* Echocardiogram 2D Echocardiogram has been performed.  Jeryl Columbialliott, Carder Yin 10/20/2016, 8:40 AM

## 2016-10-21 DIAGNOSIS — K8050 Calculus of bile duct without cholangitis or cholecystitis without obstruction: Secondary | ICD-10-CM

## 2016-10-21 LAB — CBC
HCT: 46 % (ref 39.0–52.0)
HEMOGLOBIN: 15.8 g/dL (ref 13.0–17.0)
MCH: 29.9 pg (ref 26.0–34.0)
MCHC: 34.3 g/dL (ref 30.0–36.0)
MCV: 87 fL (ref 78.0–100.0)
Platelets: 165 10*3/uL (ref 150–400)
RBC: 5.29 MIL/uL (ref 4.22–5.81)
RDW: 12.6 % (ref 11.5–15.5)
WBC: 6.6 10*3/uL (ref 4.0–10.5)

## 2016-10-21 LAB — COMPREHENSIVE METABOLIC PANEL
ALK PHOS: 46 U/L (ref 38–126)
ALT: 33 U/L (ref 17–63)
ANION GAP: 6 (ref 5–15)
AST: 14 U/L — ABNORMAL LOW (ref 15–41)
Albumin: 3.6 g/dL (ref 3.5–5.0)
BILIRUBIN TOTAL: 3.6 mg/dL — AB (ref 0.3–1.2)
BUN: 11 mg/dL (ref 6–20)
CALCIUM: 8.9 mg/dL (ref 8.9–10.3)
CO2: 28 mmol/L (ref 22–32)
Chloride: 105 mmol/L (ref 101–111)
Creatinine, Ser: 1.08 mg/dL (ref 0.61–1.24)
GFR calc non Af Amer: >60 mL/min (ref >60)
Glucose, Bld: 114 mg/dL — ABNORMAL HIGH (ref 65–99)
Potassium: 4.5 mmol/L (ref 3.5–5.1)
SODIUM: 139 mmol/L (ref 135–145)
TOTAL PROTEIN: 6.8 g/dL (ref 6.5–8.1)

## 2016-10-21 LAB — HIV ANTIBODY (ROUTINE TESTING W REFLEX): HIV SCREEN 4TH GENERATION: NONREACTIVE

## 2016-10-21 LAB — BILIRUBIN, DIRECT: BILIRUBIN DIRECT: 0.2 mg/dL (ref 0.1–0.5)

## 2016-10-21 MED ORDER — SODIUM CHLORIDE 0.9 % IV SOLN
INTRAVENOUS | Status: DC
  Administered 2016-10-21 – 2016-10-23 (×2): via INTRAVENOUS
  Filled 2016-10-21 (×4): qty 1000

## 2016-10-21 NOTE — Progress Notes (Addendum)
PROGRESS NOTE  Kyle [name] MVH:[id number] DOB: Oct 06, 1970 DOA: 10/19/2016 PCP: [facility]  Brief History:  46 year old male with a history of hypertension, hyperlipidemia, left bundle branch block presented with 1 day history of worsening epigastric and right upper quadrant pain. The patient states that he has had 2 prior episodes within the last month of similar epigastric abdominal pain radiating to RUQ that was self-limiting. However, his episode yesterday was worse than usual causing him to seek medical attention. He is one episode of emesis prior to admission. He cannot recall any clear precipitant to his previous abdominal pain. He denies any fevers, chills, chest pain, shortness breath, coughing, hemoptysis, diarrhea, dysuria, hematuria. He denies any new medications. Evaluation in the emergency department including CT of the abdomen with renal stone protocol revealed cholelithiasis with gallstones in the gallbladder neck and possible stone in the proximal CBD. Gen. surgery and GI were consulted to assist in management.  Assessment/Plan: Symptomatically Cholelithiasis with possible choledocholithiasis -appreciate GI -General surgery consult -presently pain free -10/19/2016 CT renal stone protocol--gallstones in the gallbladder neck with possible stone in the proximal CBD; nonobstructive nephrolithiasis -Continue IV fluids--add KCL to fluid -RUQ US--cholelithiasis with mild gallbladder wall thickening and distention; possible bilateral echogenic material within mildly dilated CBD -MRCP 5/29  Hypertension -restarting amlodipine -hydralazine prn SBP >180  Hyperbilirubinemia -mostly indirect -suspect Gilbert's  Hyperlipidemia -restart lipitor  LBBB -stress test 2011 reassuring -stable/chronic, seen by cardiology in past    Disposition Plan:   Home in 2-3 days  Family Communication:   Spouse updated at  bedside--5/28   Consultants:  GI/general surgery  Code Status:  FULL   DVT Prophylaxis:  [place] Lovenox   Procedures: As Listed in Progress Note Above  Antibiotics: None     Subjective: Patient denies fevers, chills, headache, chest pain, dyspnea, nausea, vomiting, diarrhea, abdominal pain, dysuria, hematuria, hematochezia, and melena.   Objective: Vitals:   10/20/16 1400 10/20/16 2250 10/21/16 0602 10/21/16 1608  BP: 138/77 127/81 131/75 131/77  Pulse: 77 76 79 73  Resp: 19 19 19 20   Temp: 97.9 F (36.6 C) 98.4 F (36.9 C) 98.3 F (36.8 C) 98.2 F (36.8 C)  TempSrc: Oral Oral Oral   SpO2: 98% 95% 95% 96%  Weight:      Height:        Intake/Output Summary (Last 24 hours) at 10/21/16 1736 Last data filed at 10/21/16 0053  Gross per 24 hour  Intake                0 ml  Output              300 ml  Net             -300 ml   Weight change:  Exam:   General:  Pt is alert, follows commands appropriately, not in acute distress  HEENT: No icterus, No thrush, No neck mass, [place]/AT  Cardiovascular: RRR, S1/S2, no rubs, no gallops  Respiratory: CTA bilaterally, no wheezing, no crackles, no rhonchi  Abdomen: Soft/+BS, non tender, non distended, no guarding  Extremities: No edema, No lymphangitis, No petechiae, No rashes, no synovitis   Data Reviewed: I have personally reviewed following labs and imaging studies Basic Metabolic Panel:  Recent Labs Lab 10/19/16 1906 10/20/16 0624 10/21/16 0631  NA 140 137 139  K 3.5 3.7 4.5  CL 103 102 105  CO2 24 27 28   GLUCOSE 206*  137* 114*  BUN 15 10 11   CREATININE 1.16 0.95 1.08  CALCIUM 9.5 8.5* 8.9   Liver Function Tests:  Recent Labs Lab 10/19/16 1906 10/20/16 0624 10/21/16 0631  AST 26 16 14*  ALT 58 42 33  ALKPHOS 61 48 46  BILITOT 2.3* 2.4* 3.6*  PROT 8.4* 6.8 6.8  ALBUMIN 4.8 3.8 3.6    Recent Labs Lab 10/19/16 1906  LIPASE 41   No results for input(s): AMMONIA in the last 168  hours. Coagulation Profile: No results for input(s): INR, PROTIME in the last 168 hours. CBC:  Recent Labs Lab 10/19/16 1906 10/20/16 0624 10/21/16 0631  WBC 10.4 8.0 6.6  NEUTROABS 7.9*  --   --   HGB 17.7* 15.8 15.8  HCT 49.1 45.5 46.0  MCV 84.7 85.8 87.0  PLT 184 165 165   Cardiac Enzymes: No results for input(s): CKTOTAL, CKMB, CKMBINDEX, TROPONINI in the last 168 hours. BNP: Invalid input(s): POCBNP CBG: No results for input(s): GLUCAP in the last 168 hours. HbA1C: No results for input(s): HGBA1C in the last 72 hours. Urine analysis:    Component Value Date/Time   COLORURINE YELLOW 10/19/2016 2153   APPEARANCEUR CLEAR 10/19/2016 2153   LABSPEC 1.013 10/19/2016 2153   PHURINE 6.0 10/19/2016 2153   GLUCOSEU 150 (A) 10/19/2016 2153   HGBUR NEGATIVE 10/19/2016 2153   BILIRUBINUR NEGATIVE 10/19/2016 2153   KETONESUR NEGATIVE 10/19/2016 2153   PROTEINUR NEGATIVE 10/19/2016 2153   NITRITE NEGATIVE 10/19/2016 2153   LEUKOCYTESUR NEGATIVE 10/19/2016 2153   Sepsis Labs: @LABRCNTIP (procalcitonin:4,lacticidven:4) )No results found for this or any previous visit (from the past 240 hour(s)).   Scheduled Meds: . amLODipine  10 mg Oral Daily  . atorvastatin  20 mg Oral Daily  . enoxaparin (LOVENOX) injection  40 mg Subcutaneous Q24H   Continuous Infusions: . sodium chloride    . 0.9 % NaCl with KCl 20 mEq / L 50 mL/hr at 10/21/16 1059    Procedures/Studies: Dg Chest 2 View  Result Date: 10/19/2016 CLINICAL DATA:  Epigastric pain radiating to right side since 1500 hours today. Recurrent mid right-sided abdominal pain, nausea and vomiting. EXAM: CHEST  2 VIEW COMPARISON:  Chest x-ray dated 09/13/2014. FINDINGS: Heart size and mediastinal contours are stable. Chronic elevation of the right hemidiaphragm with probable overlying atelectasis/scarring. Lungs otherwise clear. No pleural effusion or pneumothorax seen. Old healed fracture of the right clavicle. Also chronic  appearing fractures within the right ribs. No acute or suspicious osseous finding. IMPRESSION: 1. No active cardiopulmonary disease. No evidence of pneumonia or pulmonary edema. 2. Chronic-appearing fractures of the right clavicle and right ribs. Electronically Signed   By: Bary RichardStan  Maynard M.D.   On: 10/19/2016 20:06   Ct Renal Stone Study  Result Date: 10/19/2016 CLINICAL DATA:  Epigastric pain, radiates to the right side since 1500 hours today. EXAM: CT ABDOMEN AND PELVIS WITHOUT CONTRAST TECHNIQUE: Multidetector CT imaging of the abdomen and pelvis was performed following the standard protocol without IV contrast. COMPARISON:  None. FINDINGS: Lower chest: Right lower lobe hazy airspace disease likely reflecting atelectasis. Hepatobiliary: No focal liver abnormality is seen. Cholelithiasis without gallbladder wall thickening or biliary dilatation. Gallstones are within the gallbladder neck with possible gallstone in the proximal CBD. Pancreas: Unremarkable. No pancreatic ductal dilatation or surrounding inflammatory changes. Spleen: Normal in size without focal abnormality. Adrenals/Urinary Tract: Bilateral nonobstructing nephrolithiasis. No ureteral calculi. No hydronephrosis. No renal mass. Normal bladder. Stomach/Bowel: Stomach is within normal limits. Appendix appears normal. No evidence  of bowel wall thickening, distention, or inflammatory changes. Vascular/Lymphatic: No significant vascular findings are present. No enlarged abdominal or pelvic lymph nodes. Reproductive: Prostate is unremarkable. Other: Right inguinal fat containing hernia. Musculoskeletal: No acute osseous abnormality. No lytic or sclerotic osseous lesion. Multiple healed right rib fractures. IMPRESSION: 1. Bilateral nonobstructing nephrolithiasis. No obstructive uropathy. 2. Cholelithiasis in the gallbladder neck. Possible gallstone in the proximal CBD. 3. Normal appendix. Electronically Signed   By: Elige Ko   On: 10/19/2016 20:05    US Abdomen Limited Ruq  Result Date: 10/20/2016 CLINICAL DATA:  Right upper quadrant pain.  Epigastric pain. EXAM: US ABDOMEN LIMITED - RIGHT UPPER QUADRANT COMPARISON:  CT abdomen 10/19/2016 FINDINGS: Gallbladder: Cholelithiasis. Mild gallbladder wall thickening measuring 4.3 mm. Negative sonographic Murphy sign. Small amount of gallbladder sludge. No pericholecystic fluid. Common bile duct: Diameter: 7.5 mm. Possible echogenic material within the common bile duct may reflect gallbladder sludge versus tiny nonshadowing choledocholithiasis. Liver: No focal lesion identified. Increased hepatic parenchymal echogenicity. IMPRESSION: 1. Cholelithiasis with mild gallbladder wall thickening pain gallbladder distension concerning for acute cholecystitis. 2. Possible echogenic material within a mildly dilated common bile duct which may reflect gallbladder sludge versus tiny nonshadowing choledocholithiasis. MRCP may be helpful in this setting. Electronically Signed   By: Elige Ko   On: 10/20/2016 11:28    Kasai Beltran, DO  [facility] Pager [phone]  If 7PM-7AM, please contact night-coverage [email or web address] Password [email or web address] 10/21/2016, 5:36 PM   LOS: 2 days

## 2016-10-21 NOTE — Progress Notes (Signed)
He is doing very well. No abdominal pain whatsoever. Tolerating regular diet. Discussed with Dr. Lovell SheehanJenkins this morning.  Total bilirubin 3.6 (direct 0.2).  Other liver parameters look good.  Both ultrasound and CT inconclusive for choledocholithiasis.    Vital signs in last 24 hours: Temp:  [97.9 F (36.6 C)-98.4 F (36.9 C)] 98.3 F (36.8 C) (05/28 0602) Pulse Rate:  [76-79] 79 (05/28 0602) Resp:  [19] 19 (05/28 0602) BP: (127-138)/(75-81) 131/75 (05/28 0602) SpO2:  [95 %-98 %] 95 % (05/28 0602) Last BM Date: 10/19/16 General:   Alert,  Well-developed, well-nourished, pleasant and cooperative in NAD Abdomen:  Nondistended positive bowel sounds soft nontender without appreciable mass or organomegaly.   Extremities:  Without clubbing or edema.    Intake/Output from previous day: 05/27 0701 - 05/28 0700 In: 1243.3 [P.O.:850; I.V.:393.3] Out: 2050 [Urine:2050] Intake/Output this shift: No intake/output data recorded.  Lab Results:  Recent Labs  10/19/16 1906 10/20/16 0624 10/21/16 0631  WBC 10.4 8.0 6.6  HGB 17.7* 15.8 15.8  HCT 49.1 45.5 46.0  PLT 184 165 165   BMET  Recent Labs  10/19/16 1906 10/20/16 0624 10/21/16 0631  NA 140 137 139  K 3.5 3.7 4.5  CL 103 102 105  CO2 24 27 28   GLUCOSE 206* 137* 114*  BUN 15 10 11   CREATININE 1.16 0.95 1.08  CALCIUM 9.5 8.5* 8.9   LFT  Recent Labs  10/21/16 0631  PROT 6.8  ALBUMIN 3.6  AST 14*  ALT 33  ALKPHOS 46  BILITOT 3.6*  BILIDIR 0.2   PT/INR No results for input(s): LABPROT, INR in the last 72 hours. Hepatitis Panel No results for input(s): HEPBSAG, HCVAB, HEPAIGM, HEPBIGM in the last 72 hours. C-Diff No results for input(s): CDIFFTOX in the last 72 hours.  Studies/Results: Dg Chest 2 View  Result Date: 10/19/2016 CLINICAL DATA:  Epigastric pain radiating to right side since 1500 hours today. Recurrent mid right-sided abdominal pain, nausea and vomiting. EXAM: CHEST  2 VIEW COMPARISON:  Chest  x-ray dated 09/13/2014. FINDINGS: Heart size and mediastinal contours are stable. Chronic elevation of the right hemidiaphragm with probable overlying atelectasis/scarring. Lungs otherwise clear. No pleural effusion or pneumothorax seen. Old healed fracture of the right clavicle. Also chronic appearing fractures within the right ribs. No acute or suspicious osseous finding. IMPRESSION: 1. No active cardiopulmonary disease. No evidence of pneumonia or pulmonary edema. 2. Chronic-appearing fractures of the right clavicle and right ribs. Electronically Signed   By: Bary RichardStan  Maynard M.D.   On: 10/19/2016 20:06   Ct Renal Stone Study  Result Date: 10/19/2016 CLINICAL DATA:  Epigastric pain, radiates to the right side since 1500 hours today. EXAM: CT ABDOMEN AND PELVIS WITHOUT CONTRAST TECHNIQUE: Multidetector CT imaging of the abdomen and pelvis was performed following the standard protocol without IV contrast. COMPARISON:  None. FINDINGS: Lower chest: Right lower lobe hazy airspace disease likely reflecting atelectasis. Hepatobiliary: No focal liver abnormality is seen. Cholelithiasis without gallbladder wall thickening or biliary dilatation. Gallstones are within the gallbladder neck with possible gallstone in the proximal CBD. Pancreas: Unremarkable. No pancreatic ductal dilatation or surrounding inflammatory changes. Spleen: Normal in size without focal abnormality. Adrenals/Urinary Tract: Bilateral nonobstructing nephrolithiasis. No ureteral calculi. No hydronephrosis. No renal mass. Normal bladder. Stomach/Bowel: Stomach is within normal limits. Appendix appears normal. No evidence of bowel wall thickening, distention, or inflammatory changes. Vascular/Lymphatic: No significant vascular findings are present. No enlarged abdominal or pelvic lymph nodes. Reproductive: Prostate is unremarkable. Other: Right inguinal  fat containing hernia. Musculoskeletal: No acute osseous abnormality. No lytic or sclerotic osseous  lesion. Multiple healed right rib fractures. IMPRESSION: 1. Bilateral nonobstructing nephrolithiasis. No obstructive uropathy. 2. Cholelithiasis in the gallbladder neck. Possible gallstone in the proximal CBD. 3. Normal appendix. Electronically Signed   By: Elige Ko   On: 10/19/2016 20:05   US Abdomen Limited Ruq  Result Date: 10/20/2016 CLINICAL DATA:  Right upper quadrant pain.  Epigastric pain. EXAM: US ABDOMEN LIMITED - RIGHT UPPER QUADRANT COMPARISON:  CT abdomen 10/19/2016 FINDINGS: Gallbladder: Cholelithiasis. Mild gallbladder wall thickening measuring 4.3 mm. Negative sonographic Murphy sign. Small amount of gallbladder sludge. No pericholecystic fluid. Common bile duct: Diameter: 7.5 mm. Possible echogenic material within the common bile duct may reflect gallbladder sludge versus tiny nonshadowing choledocholithiasis. Liver: No focal lesion identified. Increased hepatic parenchymal echogenicity. IMPRESSION: 1. Cholelithiasis with mild gallbladder wall thickening pain gallbladder distension concerning for acute cholecystitis. 2. Possible echogenic material within a mildly dilated common bile duct which may reflect gallbladder sludge versus tiny nonshadowing choledocholithiasis. MRCP may be helpful in this setting. Electronically Signed   By: Elige Ko   On: 10/20/2016 11:28     Impression:   Episode of biliary colic/cholelithiasis-clinically much improved. Question of common duct stone disease on CT and ultrasound.   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  As discussed with Dr. Lovell Sheehan, I agree with the need for an MRCP to sort out prior to pursuing cholecystectomy  Recommendations:  MRCP tomorrow.  Further recommendations to follow.

## 2016-10-21 NOTE — Progress Notes (Signed)
Subjective: Patient has no right upper quadrant abdominal pain.  Objective: Vital signs in last 24 hours: Temp:  [97.9 F (36.6 C)-98.4 F (36.9 C)] 98.3 F (36.8 C) (05/28 0602) Pulse Rate:  [76-79] 79 (05/28 0602) Resp:  [19] 19 (05/28 0602) BP: (127-138)/(75-81) 131/75 (05/28 0602) SpO2:  [95 %-98 %] 95 % (05/28 0602) Last BM Date: 10/19/16  Intake/Output from previous day: 05/27 0701 - 05/28 0700 In: 1243.3 [P.O.:850; I.V.:393.3] Out: 2050 [Urine:2050] Intake/Output this shift: No intake/output data recorded.  General appearance: alert, cooperative and no distress GI: soft, non-tender; bowel sounds normal; no masses,  no organomegaly  Lab Results:   Recent Labs  10/20/16 0624 10/21/16 0631  WBC 8.0 6.6  HGB 15.8 15.8  HCT 45.5 46.0  PLT 165 165   BMET  Recent Labs  10/20/16 0624 10/21/16 0631  NA 137 139  K 3.7 4.5  CL 102 105  CO2 27 28  GLUCOSE 137* 114*  BUN 10 11  CREATININE 0.95 1.08  CALCIUM 8.5* 8.9   PT/INR No results for input(s): LABPROT, INR in the last 72 hours.  Studies/Results: Dg Chest 2 View  Result Date: 10/19/2016 CLINICAL DATA:  Epigastric pain radiating to right side since 1500 hours today. Recurrent mid right-sided abdominal pain, nausea and vomiting. EXAM: CHEST  2 VIEW COMPARISON:  Chest x-ray dated 09/13/2014. FINDINGS: Heart size and mediastinal contours are stable. Chronic elevation of the right hemidiaphragm with probable overlying atelectasis/scarring. Lungs otherwise clear. No pleural effusion or pneumothorax seen. Old healed fracture of the right clavicle. Also chronic appearing fractures within the right ribs. No acute or suspicious osseous finding. IMPRESSION: 1. No active cardiopulmonary disease. No evidence of pneumonia or pulmonary edema. 2. Chronic-appearing fractures of the right clavicle and right ribs. Electronically Signed   By: Bary Richard M.D.   On: 10/19/2016 20:06   Ct Renal Stone Study  Result Date:  10/19/2016 CLINICAL DATA:  Epigastric pain, radiates to the right side since 1500 hours today. EXAM: CT ABDOMEN AND PELVIS WITHOUT CONTRAST TECHNIQUE: Multidetector CT imaging of the abdomen and pelvis was performed following the standard protocol without IV contrast. COMPARISON:  None. FINDINGS: Lower chest: Right lower lobe hazy airspace disease likely reflecting atelectasis. Hepatobiliary: No focal liver abnormality is seen. Cholelithiasis without gallbladder wall thickening or biliary dilatation. Gallstones are within the gallbladder neck with possible gallstone in the proximal CBD. Pancreas: Unremarkable. No pancreatic ductal dilatation or surrounding inflammatory changes. Spleen: Normal in size without focal abnormality. Adrenals/Urinary Tract: Bilateral nonobstructing nephrolithiasis. No ureteral calculi. No hydronephrosis. No renal mass. Normal bladder. Stomach/Bowel: Stomach is within normal limits. Appendix appears normal. No evidence of bowel wall thickening, distention, or inflammatory changes. Vascular/Lymphatic: No significant vascular findings are present. No enlarged abdominal or pelvic lymph nodes. Reproductive: Prostate is unremarkable. Other: Right inguinal fat containing hernia. Musculoskeletal: No acute osseous abnormality. No lytic or sclerotic osseous lesion. Multiple healed right rib fractures. IMPRESSION: 1. Bilateral nonobstructing nephrolithiasis. No obstructive uropathy. 2. Cholelithiasis in the gallbladder neck. Possible gallstone in the proximal CBD. 3. Normal appendix. Electronically Signed   By: Elige Ko   On: 10/19/2016 20:05   US Abdomen Limited Ruq  Result Date: 10/20/2016 CLINICAL DATA:  Right upper quadrant pain.  Epigastric pain. EXAM: US ABDOMEN LIMITED - RIGHT UPPER QUADRANT COMPARISON:  CT abdomen 10/19/2016 FINDINGS: Gallbladder: Cholelithiasis. Mild gallbladder wall thickening measuring 4.3 mm. Negative sonographic Murphy sign. Small amount of gallbladder sludge. No  pericholecystic fluid. Common bile duct: Diameter: 7.5 mm.  Possible echogenic material within the common bile duct may reflect gallbladder sludge versus tiny nonshadowing choledocholithiasis. Liver: No focal lesion identified. Increased hepatic parenchymal echogenicity. IMPRESSION: 1. Cholelithiasis with mild gallbladder wall thickening pain gallbladder distension concerning for acute cholecystitis. 2. Possible echogenic material within a mildly dilated common bile duct which may reflect gallbladder sludge versus tiny nonshadowing choledocholithiasis. MRCP may be helpful in this setting. Electronically Signed   By: Elige KoHetal  Patel   On: 10/20/2016 11:28    Anti-infectives: Anti-infectives    None      Assessment/Plan: Impression: Cholelithiasis, possible choledocholithiasis. Total bilirubin elevated compared to yesterday. For MRCP tomorrow. Will start heart healthy diet today.  LOS: 2 days    Franky MachoMark Dominick Zertuche 10/21/2016

## 2016-10-21 NOTE — Progress Notes (Signed)
Pt refused Lovenox injection. Pt educated on prevention of blood clot in the hospital and reasoning for Lovenox order. Pt verbalized understanding and stated he didn't want the lovenox because he was getting up to the bathroom and walking around in room.

## 2016-10-22 ENCOUNTER — Inpatient Hospital Stay (HOSPITAL_COMMUNITY): Payer: Federal, State, Local not specified - PPO

## 2016-10-22 DIAGNOSIS — K8000 Calculus of gallbladder with acute cholecystitis without obstruction: Secondary | ICD-10-CM

## 2016-10-22 LAB — COMPREHENSIVE METABOLIC PANEL
ALBUMIN: 3.8 g/dL (ref 3.5–5.0)
ALK PHOS: 48 U/L (ref 38–126)
ALT: 30 U/L (ref 17–63)
ANION GAP: 8 (ref 5–15)
AST: 12 U/L — AB (ref 15–41)
BILIRUBIN TOTAL: 2 mg/dL — AB (ref 0.3–1.2)
BUN: 13 mg/dL (ref 6–20)
CALCIUM: 9 mg/dL (ref 8.9–10.3)
CO2: 28 mmol/L (ref 22–32)
Chloride: 103 mmol/L (ref 101–111)
Creatinine, Ser: 1.12 mg/dL (ref 0.61–1.24)
GFR calc Af Amer: >60 mL/min (ref >60)
GFR calc non Af Amer: >60 mL/min (ref >60)
GLUCOSE: 124 mg/dL — AB (ref 65–99)
Potassium: 4.3 mmol/L (ref 3.5–5.1)
Sodium: 139 mmol/L (ref 135–145)
TOTAL PROTEIN: 7.2 g/dL (ref 6.5–8.1)

## 2016-10-22 LAB — SURGICAL PCR SCREEN
MRSA, PCR: NEGATIVE
Staphylococcus aureus: POSITIVE — AB

## 2016-10-22 MED ORDER — CIPROFLOXACIN IN D5W 400 MG/200ML IV SOLN: 400.0000 mg | INTRAVENOUS | Status: DC

## 2016-10-22 MED ORDER — CHLORHEXIDINE GLUCONATE CLOTH 2 % EX PADS: 6.0000 | MEDICATED_PAD | Freq: Every day | CUTANEOUS | Status: DC

## 2016-10-22 MED ORDER — GADOBENATE DIMEGLUMINE 529 MG/ML IV SOLN
13.0000 mL | Freq: Once | INTRAVENOUS | Status: AC | PRN
  Administered 2016-10-22: 13 mL via INTRAVENOUS

## 2016-10-22 MED ORDER — CHLORHEXIDINE GLUCONATE CLOTH 2 % EX PADS
6.0000 | MEDICATED_PAD | Freq: Once | CUTANEOUS | Status: AC
  Administered 2016-10-22: 6 via TOPICAL

## 2016-10-22 MED ORDER — CHLORHEXIDINE GLUCONATE CLOTH 2 % EX PADS: 6.0000 | MEDICATED_PAD | Freq: Once | CUTANEOUS | Status: DC

## 2016-10-22 MED ORDER — MUPIROCIN 2 % EX OINT
1.0000 "application " | TOPICAL_OINTMENT | Freq: Two times a day (BID) | CUTANEOUS | Status: DC
  Administered 2016-10-23: 1 via NASAL
  Filled 2016-10-22: qty 22

## 2016-10-22 NOTE — Progress Notes (Signed)
Pt returned back from procedural area.

## 2016-10-22 NOTE — Plan of Care (Signed)
Problem: Pain Managment: Goal: General experience of comfort will improve Outcome: Progressing Pt has had no c/o pain this shift. Pt up ad lib in room. Pt for MRCP today.

## 2016-10-22 NOTE — Progress Notes (Addendum)
PROGRESS NOTE  Kyle Fredericksonrmand Kyle [name] RUE:[id number]:[id number] DOB: 1970-08-27 DOA: 10/19/2016 PCP: Trey SailorsPa, [facility]   Brief History: 46 year old male with a history of hypertension, hyperlipidemia, left bundle branch block presented with 1 day history of worsening epigastric and right upper quadrant pain. The patient states that he has had 2 prior episodes within the last month of similar epigastric abdominal pain radiating to RUQthat was self-limiting. However, his episode yesterday was worse than usual causing him to seek medical attention. He is one episode of emesis prior to admission. He cannot recall any clear precipitant to his previous abdominal pain. He denies any fevers, chills, chest pain, shortness breath, coughing, hemoptysis, diarrhea, dysuria, hematuria. He denies any new medications. Evaluation in the emergency department including CT of the abdomen with renal stone protocol revealed cholelithiasis with gallstones in the gallbladder neck and possible stone in the proximal CBD. Gen. surgery and GI were consulted to assist in management.  Assessment/Plan: Symptomatically Cholelithiasis with possible choledocholithiasis -appreciate GI -General surgery consult--discussed with Dr. Turner DanielsJenkins--he will take on as primary attending -presently pain free -10/19/2016 CT renal stone protocol--gallstones in the gallbladder neck with possible stone in the proximal CBD; nonobstructive nephrolithiasis -Continue IV fluids--add KCL to fluid -RUQ US--cholelithiasis with mild gallbladder wall thickening and distention; possible bilateral echogenic material within mildly dilated CBD -MRCP 5/29--not acute cholecystitis without choledocholithiasis -Planning laparoscopic cholecystectomy 10/23/2016  Hypertension -restarting amlodipine -hydralazine prn SBP >180  Hyperbilirubinemia -mostly indirect -suspect Gilbert's  Hyperlipidemia -restart lipitor  LBBB -stress test 2011  reassuring -stable/chronic, seen by cardiology in past    Disposition Plan: transfer to Dr. Lovell SheehanJenkins' service  Family Communication: Spouse updatedat bedside--5/29   Consultants: GI/general surgery  Code Status: FULL   DVT Prophylaxis: [place] Lovenox   Procedures: As Listed in Progress Note Above  Antibiotics: None   Subjective: Patient denies fevers, chills, headache, chest pain, dyspnea, nausea, vomiting, diarrhea, abdominal pain, dysuria, hematuria, hematochezia, and melena.   Objective: Vitals:   10/21/16 1608 10/21/16 2215 10/22/16 0623 10/22/16 1248  BP: 131/77 122/64 138/88 (!) 112/96  Pulse: 73 76 85 75  Resp: 20 20 20 18   Temp: 98.2 F (36.8 C) 98 F (36.7 C) 98 F (36.7 C) 97.5 F (36.4 C)  TempSrc:  Oral Oral Oral  SpO2: 96% 96% 98% 99%  Weight:      Height:        Intake/Output Summary (Last 24 hours) at 10/22/16 1515 Last data filed at 10/22/16 0900  Gross per 24 hour  Intake          3104.16 ml  Output             1550 ml  Net          1554.16 ml   Weight change:  Exam:   General:  Pt is alert, follows commands appropriately, not in acute distress  HEENT: No icterus, No thrush, No neck mass, [place]/AT  Cardiovascular: RRR, S1/S2, no rubs, no gallops  Respiratory: CTA bilaterally, no wheezing, no crackles, no rhonchi  Abdomen: Soft/+BS, non tender, non distended, no guarding  Extremities: No edema, No lymphangitis, No petechiae, No rashes, no synovitis   Data Reviewed: I have personally reviewed following labs and imaging studies Basic Metabolic Panel:  Recent Labs Lab 10/19/16 1906 10/20/16 0624 10/21/16 0631 10/22/16 0549  NA 140 137 139 139  K 3.5 3.7 4.5 4.3  CL 103 102 105 103  CO2 24 27 28  28  GLUCOSE 206* 137* 114* 124*  BUN 15 10 11 13   CREATININE 1.16 0.95 1.08 1.12  CALCIUM 9.5 8.5* 8.9 9.0   Liver Function Tests:  Recent Labs Lab 10/19/16 1906 10/20/16 0624 10/21/16 0631 10/22/16 0549  AST  26 16 14* 12*  ALT 58 42 33 30  ALKPHOS 61 48 46 48  BILITOT 2.3* 2.4* 3.6* 2.0*  PROT 8.4* 6.8 6.8 7.2  ALBUMIN 4.8 3.8 3.6 3.8    Recent Labs Lab 10/19/16 1906  LIPASE 41   No results for input(s): AMMONIA in the last 168 hours. Coagulation Profile: No results for input(s): INR, PROTIME in the last 168 hours. CBC:  Recent Labs Lab 10/19/16 1906 10/20/16 0624 10/21/16 0631  WBC 10.4 8.0 6.6  NEUTROABS 7.9*  --   --   HGB 17.7* 15.8 15.8  HCT 49.1 45.5 46.0  MCV 84.7 85.8 87.0  PLT 184 165 165   Cardiac Enzymes: No results for input(s): CKTOTAL, CKMB, CKMBINDEX, TROPONINI in the last 168 hours. BNP: Invalid input(s): POCBNP CBG: No results for input(s): GLUCAP in the last 168 hours. HbA1C: No results for input(s): HGBA1C in the last 72 hours. Urine analysis:    Component Value Date/Time   COLORURINE YELLOW 10/19/2016 2153   APPEARANCEUR CLEAR 10/19/2016 2153   LABSPEC 1.013 10/19/2016 2153   PHURINE 6.0 10/19/2016 2153   GLUCOSEU 150 (A) 10/19/2016 2153   HGBUR NEGATIVE 10/19/2016 2153   BILIRUBINUR NEGATIVE 10/19/2016 2153   KETONESUR NEGATIVE 10/19/2016 2153   PROTEINUR NEGATIVE 10/19/2016 2153   NITRITE NEGATIVE 10/19/2016 2153   LEUKOCYTESUR NEGATIVE 10/19/2016 2153   Sepsis Labs: @LABRCNTIP (procalcitonin:4,lacticidven:4) )No results found for this or any previous visit (from the past 240 hour(s)).   Scheduled Meds: . amLODipine  10 mg Oral Daily  . atorvastatin  20 mg Oral Daily  . Chlorhexidine Gluconate Cloth  6 each Topical Once   And  . Chlorhexidine Gluconate Cloth  6 each Topical Once  . enoxaparin (LOVENOX) injection  40 mg Subcutaneous Q24H   Continuous Infusions: . sodium chloride Stopped (10/21/16 1911)  . ciprofloxacin    . sodium chloride 0.9 % 1,000 mL infusion 50 mL/hr at 10/21/16 1919    Procedures/Studies: Dg Chest 2 View  Result Date: 10/19/2016 CLINICAL DATA:  Epigastric pain radiating to right side since 1500 hours  today. Recurrent mid right-sided abdominal pain, nausea and vomiting. EXAM: CHEST  2 VIEW COMPARISON:  Chest x-ray dated 09/13/2014. FINDINGS: Heart size and mediastinal contours are stable. Chronic elevation of the right hemidiaphragm with probable overlying atelectasis/scarring. Lungs otherwise clear. No pleural effusion or pneumothorax seen. Old healed fracture of the right clavicle. Also chronic appearing fractures within the right ribs. No acute or suspicious osseous finding. IMPRESSION: 1. No active cardiopulmonary disease. No evidence of pneumonia or pulmonary edema. 2. Chronic-appearing fractures of the right clavicle and right ribs. Electronically Signed   By: Bary Richard M.D.   On: 10/19/2016 20:06   Mr 3d Recon At Scanner  Result Date: 10/22/2016 CLINICAL DATA:  Acute epigastric and right upper quadrant pain. Nausea. Cholelithiasis and possible choledocholithiasis on recent ultrasound and CT. EXAM: MRI ABDOMEN WITHOUT AND WITH CONTRAST (INCLUDING MRCP) TECHNIQUE: Multiplanar multisequence MR imaging of the abdomen was performed both before and after the administration of intravenous contrast. Heavily T2-weighted images of the biliary and pancreatic ducts were obtained, and three-dimensional MRCP images were rendered by post processing. CONTRAST:  13mL MULTIHANCE GADOBENATE DIMEGLUMINE 529 MG/ML IV SOLN COMPARISON:  CT  on 10/19/2016 and ultrasound on 10/20/2016 FINDINGS: Lower chest: Mild right basilar atelectasis. Hepatobiliary: No masses identified. Mild diffuse hepatic steatosis. Cholelithiasis is demonstrated. There is mild diffuse gallbladder wall thickening and enhancement, consistent with mild acute cholecystitis. Several calculi better visualized on previous CT are likely located within the cystic duct. No evidence of biliary ductal dilatation, with common bile duct measuring 5 mm. No evidence of choledocholithiasis. Pancreas: No mass or inflammatory changes. No evidence of pancreatic ductal  dilatation or pancreas divisum. Spleen:  Within normal limits in size and appearance. Adrenals/Urinary Tract: No masses identified. No evidence of hydronephrosis. Stomach/Bowel: Visualized portions within the abdomen are unremarkable. Vascular/Lymphatic: No pathologically enlarged lymph nodes identified. No abdominal aortic aneurysm. Other:  None. Musculoskeletal:  No suspicious bone lesions identified. IMPRESSION: Cholelithiasis, and findings consistent with mild acute cholecystitis. No evidence of biliary ductal dilatation or choledocholithiasis. Mild hepatic steatosis. Mild right basilar atelectasis. Electronically Signed   By: Myles Rosenthal M.D.   On: 10/22/2016 08:13   Mr Abdomen Mrcp Vivien Rossetti Contast  Result Date: 10/22/2016 CLINICAL DATA:  Acute epigastric and right upper quadrant pain. Nausea. Cholelithiasis and possible choledocholithiasis on recent ultrasound and CT. EXAM: MRI ABDOMEN WITHOUT AND WITH CONTRAST (INCLUDING MRCP) TECHNIQUE: Multiplanar multisequence MR imaging of the abdomen was performed both before and after the administration of intravenous contrast. Heavily T2-weighted images of the biliary and pancreatic ducts were obtained, and three-dimensional MRCP images were rendered by post processing. CONTRAST:  13mL MULTIHANCE GADOBENATE DIMEGLUMINE 529 MG/ML IV SOLN COMPARISON:  CT on 10/19/2016 and ultrasound on 10/20/2016 FINDINGS: Lower chest: Mild right basilar atelectasis. Hepatobiliary: No masses identified. Mild diffuse hepatic steatosis. Cholelithiasis is demonstrated. There is mild diffuse gallbladder wall thickening and enhancement, consistent with mild acute cholecystitis. Several calculi better visualized on previous CT are likely located within the cystic duct. No evidence of biliary ductal dilatation, with common bile duct measuring 5 mm. No evidence of choledocholithiasis. Pancreas: No mass or inflammatory changes. No evidence of pancreatic ductal dilatation or pancreas divisum.  Spleen:  Within normal limits in size and appearance. Adrenals/Urinary Tract: No masses identified. No evidence of hydronephrosis. Stomach/Bowel: Visualized portions within the abdomen are unremarkable. Vascular/Lymphatic: No pathologically enlarged lymph nodes identified. No abdominal aortic aneurysm. Other:  None. Musculoskeletal:  No suspicious bone lesions identified. IMPRESSION: Cholelithiasis, and findings consistent with mild acute cholecystitis. No evidence of biliary ductal dilatation or choledocholithiasis. Mild hepatic steatosis. Mild right basilar atelectasis. Electronically Signed   By: Myles Rosenthal M.D.   On: 10/22/2016 08:13   Ct Renal Stone Study  Result Date: 10/19/2016 CLINICAL DATA:  Epigastric pain, radiates to the right side since 1500 hours today. EXAM: CT ABDOMEN AND PELVIS WITHOUT CONTRAST TECHNIQUE: Multidetector CT imaging of the abdomen and pelvis was performed following the standard protocol without IV contrast. COMPARISON:  None. FINDINGS: Lower chest: Right lower lobe hazy airspace disease likely reflecting atelectasis. Hepatobiliary: No focal liver abnormality is seen. Cholelithiasis without gallbladder wall thickening or biliary dilatation. Gallstones are within the gallbladder neck with possible gallstone in the proximal CBD. Pancreas: Unremarkable. No pancreatic ductal dilatation or surrounding inflammatory changes. Spleen: Normal in size without focal abnormality. Adrenals/Urinary Tract: Bilateral nonobstructing nephrolithiasis. No ureteral calculi. No hydronephrosis. No renal mass. Normal bladder. Stomach/Bowel: Stomach is within normal limits. Appendix appears normal. No evidence of bowel wall thickening, distention, or inflammatory changes. Vascular/Lymphatic: No significant vascular findings are present. No enlarged abdominal or pelvic lymph nodes. Reproductive: Prostate is unremarkable. Other: Right inguinal fat containing hernia.  Musculoskeletal: No acute osseous  abnormality. No lytic or sclerotic osseous lesion. Multiple healed right rib fractures. IMPRESSION: 1. Bilateral nonobstructing nephrolithiasis. No obstructive uropathy. 2. Cholelithiasis in the gallbladder neck. Possible gallstone in the proximal CBD. 3. Normal appendix. Electronically Signed   By: Elige Ko   On: 10/19/2016 20:05   US Abdomen Limited Ruq  Result Date: 10/20/2016 CLINICAL DATA:  Right upper quadrant pain.  Epigastric pain. EXAM: US ABDOMEN LIMITED - RIGHT UPPER QUADRANT COMPARISON:  CT abdomen 10/19/2016 FINDINGS: Gallbladder: Cholelithiasis. Mild gallbladder wall thickening measuring 4.3 mm. Negative sonographic Murphy sign. Small amount of gallbladder sludge. No pericholecystic fluid. Common bile duct: Diameter: 7.5 mm. Possible echogenic material within the common bile duct may reflect gallbladder sludge versus tiny nonshadowing choledocholithiasis. Liver: No focal lesion identified. Increased hepatic parenchymal echogenicity. IMPRESSION: 1. Cholelithiasis with mild gallbladder wall thickening pain gallbladder distension concerning for acute cholecystitis. 2. Possible echogenic material within a mildly dilated common bile duct which may reflect gallbladder sludge versus tiny nonshadowing choledocholithiasis. MRCP may be helpful in this setting. Electronically Signed   By: Elige Ko   On: 10/20/2016 11:28    Liesa Tsan, DO  [facility] Pager [phone]  If 7PM-7AM, please contact night-coverage [email or web address] Password [email or web address] 10/22/2016, 3:15 PM   LOS: 3 days

## 2016-10-22 NOTE — Progress Notes (Signed)
    Subjective: No abdominal pain, N/V. Feels good. Wants to go home.   Objective: Vital signs in last 24 hours: Temp:  [98 F (36.7 C)-98.2 F (36.8 C)] 98 F (36.7 C) (05/29 54090623) Pulse Rate:  [73-85] 85 (05/29 0623) Resp:  [20] 20 (05/29 0623) BP: (122-138)/(64-88) 138/88 (05/29 0623) SpO2:  [96 %-98 %] 98 % (05/29 0623) Last BM Date: 10/19/16 General:   Alert and oriented, pleasant Head:  Normocephalic and atraumatic. Eyes:  No icterus, sclera clear. Conjuctiva pink.  Abdomen:  Bowel sounds present, soft, non-tender, non-distended. No HSM or hernias noted. No rebound or guarding. No masses appreciated  Msk:  Symmetrical without gross deformities. Normal posture. Extremities:  Without  edema. Neurologic:  Alert and  oriented x4 Psych:  Alert and cooperative. Normal mood and affect.  Intake/Output from previous day: 05/28 0701 - 05/29 0700 In: 3104.2 [P.O.:480; I.V.:2624.2] Out: 1550 [Urine:1550] Intake/Output this shift: No intake/output data recorded.  Lab Results:  Recent Labs  10/19/16 1906 10/20/16 0624 10/21/16 0631  WBC 10.4 8.0 6.6  HGB 17.7* 15.8 15.8  HCT 49.1 45.5 46.0  PLT 184 165 165   BMET  Recent Labs  10/20/16 0624 10/21/16 0631 10/22/16 0549  NA 137 139 139  K 3.7 4.5 4.3  CL 102 105 103  CO2 27 28 28   GLUCOSE 137* 114* 124*  BUN 10 11 13   CREATININE 0.95 1.08 1.12  CALCIUM 8.5* 8.9 9.0   LFT  Recent Labs  10/20/16 0624 10/21/16 0631 10/22/16 0549  PROT 6.8 6.8 7.2  ALBUMIN 3.8 3.6 3.8  AST 16 14* 12*  ALT 42 33 30  ALKPHOS 48 46 48  BILITOT 2.4* 3.6* 2.0*  BILIDIR  --  0.2  --      Studies/Results: [place] Abdomen Limited Ruq  Result Date: 10/20/2016 CLINICAL DATA:  Right upper quadrant pain.  Epigastric pain. EXAM: US ABDOMEN LIMITED - RIGHT UPPER QUADRANT COMPARISON:  CT abdomen 10/19/2016 FINDINGS: Gallbladder: Cholelithiasis. Mild gallbladder wall thickening measuring 4.3 mm. Negative sonographic Murphy sign. Small  amount of gallbladder sludge. No pericholecystic fluid. Common bile duct: Diameter: 7.5 mm. Possible echogenic material within the common bile duct may reflect gallbladder sludge versus tiny nonshadowing choledocholithiasis. Liver: No focal lesion identified. Increased hepatic parenchymal echogenicity. IMPRESSION: 1. Cholelithiasis with mild gallbladder wall thickening pain gallbladder distension concerning for acute cholecystitis. 2. Possible echogenic material within a mildly dilated common bile duct which may reflect gallbladder sludge versus tiny nonshadowing choledocholithiasis. MRCP may be helpful in this setting. Electronically Signed   By: Elige KoHetal  Patel   On: 10/20/2016 11:28    Assessment: 46 year old male admitted with episode of biliary colic and found to have cholelithiasis annd possible echogenic material within CBD duct. MRCP just completed this morning. Clinically, patient is asymptomatic and feels well. LFTs more consistent with Gilbert's syndrome, with chronically elevated bilirubin. Await MRCP findings today.   Plan: MRCP completed this morning: will await findings Surgery on board Further recommendations to follow   Gelene MinkAnna W. Wajiha Versteeg, PhD, ANP-BC [facility]    LOS: 3 days    10/22/2016, 7:40 AM   Addendum: reviewed MRCP. No CBD stone. Regular diet. Dr. Lovell SheehanJenkins to perform cholecystectomy no 5/30. Will follow peripherally.  Gelene MinkAnna W. Ermal Haberer, PhD, ANP-BC [facility]

## 2016-10-22 NOTE — Progress Notes (Signed)
Subjective: Patient has no abdominal pain.  Objective: Vital signs in last 24 hours: Temp:  [98 F (36.7 C)-98.2 F (36.8 C)] 98 F (36.7 C) (05/29 0623) Pulse Rate:  [73-85] 85 (05/29 0623) Resp:  [20] 20 (05/29 0623) BP: (122-138)/(64-88) 138/88 (05/29 0623) SpO2:  [96 %-98 %] 98 % (05/29 0623) Last BM Date: 10/19/16  Intake/Output from previous day: 05/28 0701 - 05/29 0700 In: 3104.2 [P.O.:480; I.V.:2624.2] Out: 1550 [Urine:1550] Intake/Output this shift: No intake/output data recorded.  General appearance: alert, cooperative and no distress GI: soft, non-tender; bowel sounds normal; no masses,  no organomegaly  Lab Results:   Recent Labs  10/20/16 0624 10/21/16 0631  WBC 8.0 6.6  HGB 15.8 15.8  HCT 45.5 46.0  PLT 165 165   BMET  Recent Labs  10/21/16 0631 10/22/16 0549  NA 139 139  K 4.5 4.3  CL 105 103  CO2 28 28  GLUCOSE 114* 124*  BUN 11 13  CREATININE 1.08 1.12  CALCIUM 8.9 9.0   PT/INR No results for input(s): LABPROT, INR in the last 72 hours.  Studies/Results: Mr 3d Recon At Scanner  Result Date: 10/22/2016 CLINICAL DATA:  Acute epigastric and right upper quadrant pain. Nausea. Cholelithiasis and possible choledocholithiasis on recent ultrasound and CT. EXAM: MRI ABDOMEN WITHOUT AND WITH CONTRAST (INCLUDING MRCP) TECHNIQUE: Multiplanar multisequence MR imaging of the abdomen was performed both before and after the administration of intravenous contrast. Heavily T2-weighted images of the biliary and pancreatic ducts were obtained, and three-dimensional MRCP images were rendered by post processing. CONTRAST:  13mL MULTIHANCE GADOBENATE DIMEGLUMINE 529 MG/ML IV SOLN COMPARISON:  CT on 10/19/2016 and ultrasound on 10/20/2016 FINDINGS: Lower chest: Mild right basilar atelectasis. Hepatobiliary: No masses identified. Mild diffuse hepatic steatosis. Cholelithiasis is demonstrated. There is mild diffuse gallbladder wall thickening and enhancement,  consistent with mild acute cholecystitis. Several calculi better visualized on previous CT are likely located within the cystic duct. No evidence of biliary ductal dilatation, with common bile duct measuring 5 mm. No evidence of choledocholithiasis. Pancreas: No mass or inflammatory changes. No evidence of pancreatic ductal dilatation or pancreas divisum. Spleen:  Within normal limits in size and appearance. Adrenals/Urinary Tract: No masses identified. No evidence of hydronephrosis. Stomach/Bowel: Visualized portions within the abdomen are unremarkable. Vascular/Lymphatic: No pathologically enlarged lymph nodes identified. No abdominal aortic aneurysm. Other:  None. Musculoskeletal:  No suspicious bone lesions identified. IMPRESSION: Cholelithiasis, and findings consistent with mild acute cholecystitis. No evidence of biliary ductal dilatation or choledocholithiasis. Mild hepatic steatosis. Mild right basilar atelectasis. Electronically Signed   By: Myles RosenthalJohn  Stahl M.D.   On: 10/22/2016 08:13   Mr Abdomen Mrcp Vivien RossettiW Wo Contast  Result Date: 10/22/2016 CLINICAL DATA:  Acute epigastric and right upper quadrant pain. Nausea. Cholelithiasis and possible choledocholithiasis on recent ultrasound and CT. EXAM: MRI ABDOMEN WITHOUT AND WITH CONTRAST (INCLUDING MRCP) TECHNIQUE: Multiplanar multisequence MR imaging of the abdomen was performed both before and after the administration of intravenous contrast. Heavily T2-weighted images of the biliary and pancreatic ducts were obtained, and three-dimensional MRCP images were rendered by post processing. CONTRAST:  13mL MULTIHANCE GADOBENATE DIMEGLUMINE 529 MG/ML IV SOLN COMPARISON:  CT on 10/19/2016 and ultrasound on 10/20/2016 FINDINGS: Lower chest: Mild right basilar atelectasis. Hepatobiliary: No masses identified. Mild diffuse hepatic steatosis. Cholelithiasis is demonstrated. There is mild diffuse gallbladder wall thickening and enhancement, consistent with mild acute  cholecystitis. Several calculi better visualized on previous CT are likely located within the cystic duct. No evidence of biliary  ductal dilatation, with common bile duct measuring 5 mm. No evidence of choledocholithiasis. Pancreas: No mass or inflammatory changes. No evidence of pancreatic ductal dilatation or pancreas divisum. Spleen:  Within normal limits in size and appearance. Adrenals/Urinary Tract: No masses identified. No evidence of hydronephrosis. Stomach/Bowel: Visualized portions within the abdomen are unremarkable. Vascular/Lymphatic: No pathologically enlarged lymph nodes identified. No abdominal aortic aneurysm. Other:  None. Musculoskeletal:  No suspicious bone lesions identified. IMPRESSION: Cholelithiasis, and findings consistent with mild acute cholecystitis. No evidence of biliary ductal dilatation or choledocholithiasis. Mild hepatic steatosis. Mild right basilar atelectasis. Electronically Signed   By: Myles Rosenthal M.D.   On: 10/22/2016 08:13    Anti-infectives: Anti-infectives    Start     Dose/Rate Route Frequency Ordered Stop   10/22/16 1330  ciprofloxacin (CIPRO) IVPB 400 mg     400 mg 200 mL/hr over 60 Minutes Intravenous On call to O.R. 10/22/16 0935 10/23/16 0559      Assessment/Plan: Impression: Cholecystitis, cholelithiasis. MRCP was negative for choledocholithiasis. We'll proceed with laparoscopic cholecystectomy tomorrow. The risks and benefits of the procedure including bleeding, infection, hepatobiliary injury, and the possibility of an open procedure were fully explained to the patient, who gave informed consent.  LOS: 3 days    Franky Macho 10/22/2016

## 2016-10-22 NOTE — Progress Notes (Signed)
MRCP without evidence of CBD stone. Cholecystectomy planned for tomorrow by Dr. Lovell SheehanJenkins. Regular diet today.

## 2016-10-23 ENCOUNTER — Inpatient Hospital Stay (HOSPITAL_COMMUNITY): Payer: Federal, State, Local not specified - PPO | Admitting: Anesthesiology

## 2016-10-23 ENCOUNTER — Encounter (HOSPITAL_COMMUNITY)
Admission: EM | Disposition: A | Discharge status: Home / Self Care | Payer: Self-pay | Source: Home / Self Care | Attending: Internal Medicine

## 2016-10-23 ENCOUNTER — Encounter (HOSPITAL_COMMUNITY): Payer: Self-pay | Admitting: *Deleted

## 2016-10-23 HISTORY — PX: CHOLECYSTECTOMY: SHX55

## 2016-10-23 SURGERY — LAPAROSCOPIC CHOLECYSTECTOMY
Anesthesia: General | Site: Abdomen

## 2016-10-23 MED ORDER — LIDOCAINE HCL (PF) 1 % IJ SOLN
INTRAMUSCULAR | Status: AC
  Filled 2016-10-23: qty 5

## 2016-10-23 MED ORDER — ROCURONIUM BROMIDE 100 MG/10ML IV SOLN
INTRAVENOUS | Status: DC | PRN
  Administered 2016-10-23: 5 mg via INTRAVENOUS
  Administered 2016-10-23: 25 mg via INTRAVENOUS
  Administered 2016-10-23: 5 mg via INTRAVENOUS

## 2016-10-23 MED ORDER — DEXAMETHASONE SODIUM PHOSPHATE 4 MG/ML IJ SOLN
INTRAMUSCULAR | Status: AC
  Filled 2016-10-23: qty 1

## 2016-10-23 MED ORDER — DIPHENHYDRAMINE HCL 50 MG/ML IJ SOLN: 25.0000 mg | Freq: Four times a day (QID) | INTRAMUSCULAR | Status: DC | PRN

## 2016-10-23 MED ORDER — SCOPOLAMINE 1 MG/3DAYS TD PT72
1.0000 | MEDICATED_PATCH | Freq: Once | TRANSDERMAL | Status: DC
  Administered 2016-10-23: 1.5 mg via TRANSDERMAL

## 2016-10-23 MED ORDER — ACETAMINOPHEN 650 MG RE SUPP: 650.0000 mg | Freq: Four times a day (QID) | RECTAL | Status: DC | PRN

## 2016-10-23 MED ORDER — KETOROLAC TROMETHAMINE 30 MG/ML IJ SOLN
30.0000 mg | Freq: Once | INTRAMUSCULAR | Status: AC
  Administered 2016-10-23: 30 mg via INTRAVENOUS

## 2016-10-23 MED ORDER — CIPROFLOXACIN IN D5W 400 MG/200ML IV SOLN
INTRAVENOUS | Status: AC
  Filled 2016-10-23: qty 200

## 2016-10-23 MED ORDER — FENTANYL CITRATE (PF) 100 MCG/2ML IJ SOLN
INTRAMUSCULAR | Status: DC | PRN
  Administered 2016-10-23 (×2): 50 ug via INTRAVENOUS
  Administered 2016-10-23: 100 ug via INTRAVENOUS
  Administered 2016-10-23: 50 ug via INTRAVENOUS

## 2016-10-23 MED ORDER — ONDANSETRON HCL 4 MG/2ML IJ SOLN
INTRAMUSCULAR | Status: AC
  Filled 2016-10-23: qty 2

## 2016-10-23 MED ORDER — MUPIROCIN 2 % EX OINT
TOPICAL_OINTMENT | CUTANEOUS | Status: AC
  Filled 2016-10-23: qty 22

## 2016-10-23 MED ORDER — LIDOCAINE HCL (CARDIAC) 10 MG/ML IV SOLN
INTRAVENOUS | Status: DC | PRN
  Administered 2016-10-23: 50 mg via INTRAVENOUS

## 2016-10-23 MED ORDER — 0.9 % SODIUM CHLORIDE (POUR BTL) OPTIME
TOPICAL | Status: DC | PRN
  Administered 2016-10-23: 1000 mL

## 2016-10-23 MED ORDER — ONDANSETRON HCL 4 MG PO TABS: 4.0000 mg | ORAL_TABLET | Freq: Three times a day (TID) | ORAL | 1 refills | Status: DC | PRN

## 2016-10-23 MED ORDER — MUPIROCIN 2 % EX OINT: 1.0000 "application " | TOPICAL_OINTMENT | Freq: Two times a day (BID) | CUTANEOUS | 0 refills | Status: DC

## 2016-10-23 MED ORDER — ENOXAPARIN SODIUM 40 MG/0.4ML ~~LOC~~ SOLN: 40.0000 mg | SUBCUTANEOUS | Status: DC

## 2016-10-23 MED ORDER — SIMETHICONE 80 MG PO CHEW: 40.0000 mg | CHEWABLE_TABLET | Freq: Four times a day (QID) | ORAL | Status: DC | PRN

## 2016-10-23 MED ORDER — SODIUM CHLORIDE 0.9 % IR SOLN
Status: DC | PRN
  Administered 2016-10-23: 3000 mL

## 2016-10-23 MED ORDER — CIPROFLOXACIN IN D5W 400 MG/200ML IV SOLN: 400.0000 mg | Freq: Two times a day (BID) | INTRAVENOUS | Status: DC

## 2016-10-23 MED ORDER — ACETAMINOPHEN 325 MG PO TABS: 650.0000 mg | ORAL_TABLET | Freq: Four times a day (QID) | ORAL | Status: DC | PRN

## 2016-10-23 MED ORDER — DIPHENHYDRAMINE HCL 25 MG PO CAPS: 25.0000 mg | ORAL_CAPSULE | Freq: Four times a day (QID) | ORAL | Status: DC | PRN

## 2016-10-23 MED ORDER — HYDROMORPHONE HCL 1 MG/ML IJ SOLN: 1.0000 mg | INTRAMUSCULAR | Status: DC | PRN

## 2016-10-23 MED ORDER — OXYCODONE-ACETAMINOPHEN 7.5-325 MG PO TABS: 1.0000 | ORAL_TABLET | ORAL | 0 refills | Status: DC | PRN

## 2016-10-23 MED ORDER — POVIDONE-IODINE 10 % OINT PACKET
TOPICAL_OINTMENT | CUTANEOUS | Status: DC | PRN
  Administered 2016-10-23: 1 via TOPICAL

## 2016-10-23 MED ORDER — DEXAMETHASONE SODIUM PHOSPHATE 4 MG/ML IJ SOLN
4.0000 mg | Freq: Once | INTRAMUSCULAR | Status: AC
  Administered 2016-10-23: 4 mg via INTRAVENOUS

## 2016-10-23 MED ORDER — FENTANYL CITRATE (PF) 100 MCG/2ML IJ SOLN: 25.0000 ug | INTRAMUSCULAR | Status: DC | PRN

## 2016-10-23 MED ORDER — NEOSTIGMINE METHYLSULFATE 10 MG/10ML IV SOLN
INTRAVENOUS | Status: AC
  Filled 2016-10-23: qty 1

## 2016-10-23 MED ORDER — ONDANSETRON HCL 4 MG/2ML IJ SOLN
4.0000 mg | Freq: Once | INTRAMUSCULAR | Status: AC
  Administered 2016-10-23: 4 mg via INTRAVENOUS

## 2016-10-23 MED ORDER — ROCURONIUM BROMIDE 50 MG/5ML IV SOLN
INTRAVENOUS | Status: AC
  Filled 2016-10-23: qty 1

## 2016-10-23 MED ORDER — MIDAZOLAM HCL 2 MG/2ML IJ SOLN
1.0000 mg | INTRAMUSCULAR | Status: DC
  Administered 2016-10-23: 2 mg via INTRAVENOUS

## 2016-10-23 MED ORDER — HEMOSTATIC AGENTS (NO CHARGE) OPTIME
TOPICAL | Status: DC | PRN
  Administered 2016-10-23 (×2): 1 via TOPICAL

## 2016-10-23 MED ORDER — NEOSTIGMINE METHYLSULFATE 10 MG/10ML IV SOLN
INTRAVENOUS | Status: DC | PRN
  Administered 2016-10-23: 3 mg via INTRAVENOUS

## 2016-10-23 MED ORDER — BUPIVACAINE HCL (PF) 0.5 % IJ SOLN
INTRAMUSCULAR | Status: DC | PRN
  Administered 2016-10-23: 10 mL

## 2016-10-23 MED ORDER — SCOPOLAMINE 1 MG/3DAYS TD PT72
MEDICATED_PATCH | TRANSDERMAL | Status: AC
  Filled 2016-10-23: qty 1

## 2016-10-23 MED ORDER — KETOROLAC TROMETHAMINE 30 MG/ML IJ SOLN
INTRAMUSCULAR | Status: AC
  Filled 2016-10-23: qty 1

## 2016-10-23 MED ORDER — OXYCODONE-ACETAMINOPHEN 5-325 MG PO TABS: 1.0000 | ORAL_TABLET | ORAL | Status: DC | PRN

## 2016-10-23 MED ORDER — CIPROFLOXACIN HCL 500 MG PO TABS: 500.0000 mg | ORAL_TABLET | Freq: Two times a day (BID) | ORAL | 0 refills | Status: DC

## 2016-10-23 MED ORDER — GLYCOPYRROLATE 0.2 MG/ML IJ SOLN
INTRAMUSCULAR | Status: AC
  Filled 2016-10-23: qty 3

## 2016-10-23 MED ORDER — MIDAZOLAM HCL 2 MG/2ML IJ SOLN
INTRAMUSCULAR | Status: AC
  Filled 2016-10-23: qty 2

## 2016-10-23 MED ORDER — GLYCOPYRROLATE 0.2 MG/ML IJ SOLN
INTRAMUSCULAR | Status: DC | PRN
  Administered 2016-10-23: 0.6 mg via INTRAVENOUS

## 2016-10-23 MED ORDER — FENTANYL CITRATE (PF) 250 MCG/5ML IJ SOLN
INTRAMUSCULAR | Status: AC
  Filled 2016-10-23: qty 5

## 2016-10-23 MED ORDER — PROPOFOL 10 MG/ML IV BOLUS
INTRAVENOUS | Status: AC
  Filled 2016-10-23: qty 20

## 2016-10-23 MED ORDER — POVIDONE-IODINE 10 % EX OINT
TOPICAL_OINTMENT | CUTANEOUS | Status: AC
  Filled 2016-10-23: qty 1

## 2016-10-23 MED ORDER — LACTATED RINGERS IV SOLN
INTRAVENOUS | Status: DC
  Administered 2016-10-23: 1000 mL via INTRAVENOUS

## 2016-10-23 MED ORDER — CIPROFLOXACIN IN D5W 400 MG/200ML IV SOLN
INTRAVENOUS | Status: DC | PRN
  Administered 2016-10-23: 400 mg via INTRAVENOUS

## 2016-10-23 MED ORDER — BUPIVACAINE HCL (PF) 0.5 % IJ SOLN
INTRAMUSCULAR | Status: AC
  Filled 2016-10-23: qty 30

## 2016-10-23 MED ORDER — LORAZEPAM 2 MG/ML IJ SOLN: 1.0000 mg | INTRAMUSCULAR | Status: DC | PRN

## 2016-10-23 MED ORDER — SODIUM CHLORIDE 0.9 % IV SOLN: INTRAVENOUS | Status: DC

## 2016-10-23 MED ORDER — PROPOFOL 10 MG/ML IV BOLUS
INTRAVENOUS | Status: DC | PRN
  Administered 2016-10-23: 150 mg via INTRAVENOUS
  Administered 2016-10-23: 20 mg via INTRAVENOUS

## 2016-10-23 SURGICAL SUPPLY — 45 items
APPLICATOR ARISTA FLEXITIP XL (MISCELLANEOUS) ×2 IMPLANT
APPLIER CLIP LAPSCP 10X32 DD (CLIP) ×2 IMPLANT
BAG HAMPER (MISCELLANEOUS) ×2 IMPLANT
BAG RETRIEVAL 10 (BASKET) ×1
CHLORAPREP W/TINT 26ML (MISCELLANEOUS) ×2 IMPLANT
CLOTH BEACON ORANGE TIMEOUT ST (SAFETY) ×2 IMPLANT
COVER LIGHT HANDLE STERIS (MISCELLANEOUS) ×4 IMPLANT
DECANTER SPIKE VIAL GLASS SM (MISCELLANEOUS) ×2 IMPLANT
ELECT REM PT RETURN 9FT ADLT (ELECTROSURGICAL) ×2
ELECTRODE REM PT RTRN 9FT ADLT (ELECTROSURGICAL) ×1 IMPLANT
FILTER SMOKE EVAC LAPAROSHD (FILTER) ×2 IMPLANT
FORMALIN 10 PREFIL 120ML (MISCELLANEOUS) ×2 IMPLANT
GLOVE BIOGEL M 7.0 STRL (GLOVE) ×2 IMPLANT
GLOVE BIOGEL PI IND STRL 7.0 (GLOVE) ×1 IMPLANT
GLOVE BIOGEL PI INDICATOR 7.0 (GLOVE) ×1
GLOVE EXAM NITRILE MD LF STRL (GLOVE) ×2 IMPLANT
GLOVE SURG SS PI 7.5 STRL IVOR (GLOVE) ×2 IMPLANT
GOWN STRL REUS W/ TWL XL LVL3 (GOWN DISPOSABLE) ×1 IMPLANT
GOWN STRL REUS W/TWL LRG LVL3 (GOWN DISPOSABLE) ×4 IMPLANT
GOWN STRL REUS W/TWL XL LVL3 (GOWN DISPOSABLE) ×1
HEMOSTAT ARISTA ABSORB 3G PWDR (MISCELLANEOUS) ×2 IMPLANT
HEMOSTAT SNOW SURGICEL 2X4 (HEMOSTASIS) ×2 IMPLANT
INST SET LAPROSCOPIC AP (KITS) ×2 IMPLANT
IV NS IRRIG 3000ML ARTHROMATIC (IV SOLUTION) ×2 IMPLANT
KIT ROOM TURNOVER APOR (KITS) ×2 IMPLANT
MANIFOLD NEPTUNE II (INSTRUMENTS) ×2 IMPLANT
NEEDLE INSUFFLATION 14GA 120MM (NEEDLE) ×2 IMPLANT
NS IRRIG 1000ML POUR BTL (IV SOLUTION) ×2 IMPLANT
PACK LAP CHOLE LZT030E (CUSTOM PROCEDURE TRAY) ×2 IMPLANT
PAD ARMBOARD 7.5X6 YLW CONV (MISCELLANEOUS) ×2 IMPLANT
SET BASIN LINEN APH (SET/KITS/TRAYS/PACK) ×2 IMPLANT
SET TUBE IRRIG SUCTION NO TIP (IRRIGATION / IRRIGATOR) ×2 IMPLANT
SLEEVE ENDOPATH XCEL 5M (ENDOMECHANICALS) ×2 IMPLANT
SPONGE GAUZE 2X2 8PLY STRL LF (GAUZE/BANDAGES/DRESSINGS) ×2 IMPLANT
STAPLER VISISTAT (STAPLE) ×2 IMPLANT
SUT VICRYL 0 UR6 27IN ABS (SUTURE) ×4 IMPLANT
SYS BAG RETRIEVAL 10MM (BASKET) ×1
SYSTEM BAG RETRIEVAL 10MM (BASKET) ×1 IMPLANT
TAPE CLOTH SURG 4X10 WHT LF (GAUZE/BANDAGES/DRESSINGS) ×2 IMPLANT
TROCAR ENDO BLADELESS 11MM (ENDOMECHANICALS) ×2 IMPLANT
TROCAR XCEL NON-BLD 5MMX100MML (ENDOMECHANICALS) ×2 IMPLANT
TROCAR XCEL UNIV SLVE 11M 100M (ENDOMECHANICALS) ×2 IMPLANT
TUBE CONNECTING 12X1/4 (SUCTIONS) ×2 IMPLANT
TUBING INSUFFLATION (TUBING) ×2 IMPLANT
WARMER LAPAROSCOPE (MISCELLANEOUS) ×2 IMPLANT

## 2016-10-23 NOTE — H&P (View-Only) (Signed)
Subjective: Patient has no abdominal pain.  Objective: Vital signs in last 24 hours: Temp:  [98 F (36.7 C)-98.2 F (36.8 C)] 98 F (36.7 C) (05/29 0623) Pulse Rate:  [73-85] 85 (05/29 0623) Resp:  [20] 20 (05/29 0623) BP: (122-138)/(64-88) 138/88 (05/29 0623) SpO2:  [96 %-98 %] 98 % (05/29 0623) Last BM Date: 10/19/16  Intake/Output from previous day: 05/28 0701 - 05/29 0700 In: 3104.2 [P.O.:480; I.V.:2624.2] Out: 1550 [Urine:1550] Intake/Output this shift: No intake/output data recorded.  General appearance: alert, cooperative and no distress GI: soft, non-tender; bowel sounds normal; no masses,  no organomegaly  Lab Results:   Recent Labs  10/20/16 0624 10/21/16 0631  WBC 8.0 6.6  HGB 15.8 15.8  HCT 45.5 46.0  PLT 165 165   BMET  Recent Labs  10/21/16 0631 10/22/16 0549  NA 139 139  K 4.5 4.3  CL 105 103  CO2 28 28  GLUCOSE 114* 124*  BUN 11 13  CREATININE 1.08 1.12  CALCIUM 8.9 9.0   PT/INR No results for input(s): LABPROT, INR in the last 72 hours.  Studies/Results: Mr 3d Recon At Scanner  Result Date: 10/22/2016 CLINICAL DATA:  Acute epigastric and right upper quadrant pain. Nausea. Cholelithiasis and possible choledocholithiasis on recent ultrasound and CT. EXAM: MRI ABDOMEN WITHOUT AND WITH CONTRAST (INCLUDING MRCP) TECHNIQUE: Multiplanar multisequence MR imaging of the abdomen was performed both before and after the administration of intravenous contrast. Heavily T2-weighted images of the biliary and pancreatic ducts were obtained, and three-dimensional MRCP images were rendered by post processing. CONTRAST:  13mL MULTIHANCE GADOBENATE DIMEGLUMINE 529 MG/ML IV SOLN COMPARISON:  CT on 10/19/2016 and ultrasound on 10/20/2016 FINDINGS: Lower chest: Mild right basilar atelectasis. Hepatobiliary: No masses identified. Mild diffuse hepatic steatosis. Cholelithiasis is demonstrated. There is mild diffuse gallbladder wall thickening and enhancement,  consistent with mild acute cholecystitis. Several calculi better visualized on previous CT are likely located within the cystic duct. No evidence of biliary ductal dilatation, with common bile duct measuring 5 mm. No evidence of choledocholithiasis. Pancreas: No mass or inflammatory changes. No evidence of pancreatic ductal dilatation or pancreas divisum. Spleen:  Within normal limits in size and appearance. Adrenals/Urinary Tract: No masses identified. No evidence of hydronephrosis. Stomach/Bowel: Visualized portions within the abdomen are unremarkable. Vascular/Lymphatic: No pathologically enlarged lymph nodes identified. No abdominal aortic aneurysm. Other:  None. Musculoskeletal:  No suspicious bone lesions identified. IMPRESSION: Cholelithiasis, and findings consistent with mild acute cholecystitis. No evidence of biliary ductal dilatation or choledocholithiasis. Mild hepatic steatosis. Mild right basilar atelectasis. Electronically Signed   By: John  Stahl M.D.   On: 10/22/2016 08:13   Mr Abdomen Mrcp W Wo Contast  Result Date: 10/22/2016 CLINICAL DATA:  Acute epigastric and right upper quadrant pain. Nausea. Cholelithiasis and possible choledocholithiasis on recent ultrasound and CT. EXAM: MRI ABDOMEN WITHOUT AND WITH CONTRAST (INCLUDING MRCP) TECHNIQUE: Multiplanar multisequence MR imaging of the abdomen was performed both before and after the administration of intravenous contrast. Heavily T2-weighted images of the biliary and pancreatic ducts were obtained, and three-dimensional MRCP images were rendered by post processing. CONTRAST:  13mL MULTIHANCE GADOBENATE DIMEGLUMINE 529 MG/ML IV SOLN COMPARISON:  CT on 10/19/2016 and ultrasound on 10/20/2016 FINDINGS: Lower chest: Mild right basilar atelectasis. Hepatobiliary: No masses identified. Mild diffuse hepatic steatosis. Cholelithiasis is demonstrated. There is mild diffuse gallbladder wall thickening and enhancement, consistent with mild acute  cholecystitis. Several calculi better visualized on previous CT are likely located within the cystic duct. No evidence of biliary   ductal dilatation, with common bile duct measuring 5 mm. No evidence of choledocholithiasis. Pancreas: No mass or inflammatory changes. No evidence of pancreatic ductal dilatation or pancreas divisum. Spleen:  Within normal limits in size and appearance. Adrenals/Urinary Tract: No masses identified. No evidence of hydronephrosis. Stomach/Bowel: Visualized portions within the abdomen are unremarkable. Vascular/Lymphatic: No pathologically enlarged lymph nodes identified. No abdominal aortic aneurysm. Other:  None. Musculoskeletal:  No suspicious bone lesions identified. IMPRESSION: Cholelithiasis, and findings consistent with mild acute cholecystitis. No evidence of biliary ductal dilatation or choledocholithiasis. Mild hepatic steatosis. Mild right basilar atelectasis. Electronically Signed   By: John  Stahl M.D.   On: 10/22/2016 08:13    Anti-infectives: Anti-infectives    Start     Dose/Rate Route Frequency Ordered Stop   10/22/16 1330  ciprofloxacin (CIPRO) IVPB 400 mg     400 mg 200 mL/hr over 60 Minutes Intravenous On call to O.R. 10/22/16 0935 10/23/16 0559      Assessment/Plan: Impression: Cholecystitis, cholelithiasis. MRCP was negative for choledocholithiasis. We'll proceed with laparoscopic cholecystectomy tomorrow. The risks and benefits of the procedure including bleeding, infection, hepatobiliary injury, and the possibility of an open procedure were fully explained to the patient, who gave informed consent.  LOS: 3 days    Kyle [name] 10/22/2016 

## 2016-10-23 NOTE — Discharge Summary (Signed)
Physician Discharge Summary  Patient ID: Kyle [name] MRN: [id number] DOB/AGE: [birth year] 48 y.o.  Admit date: 10/19/2016 Discharge date: 10/23/2016  Admission Diagnoses:Cholecystitis, cholelithiasis, elevated total bilirubin  Discharge Diagnoses: Same Active Problems:   Essential hypertension   LBBB (left bundle branch block)   Hyperlipemia   Choledocholithiasis   Heart murmur   Calculus of bile duct without cholecystitis and without obstruction   Total bilirubin, elevated   Biliary colic   Calculus of gallbladder with acute cholecystitis without obstruction   Discharged Condition: good  Hospital Course: Patient is a 46 year old white male who presented to the emergency room with worsening right upper quadrant abdominal pain. CT scan of the abdomen revealed cholecystitis with possible choledocholithiasis. He was admitted to hospital for further evaluation and treatment. Ultrasound gallbladder revealed cholelithiasis with possible debris or sludge within the common bile duct. Both Gen. surgery and gastroenterology were consulted. MRCP was performed which was negative for choledocholithiasis. The patient subsequently underwent a laparoscopic cholecystectomy on 10/23/2016. He tolerated the procedure well. Acute cholecystitis was found. The patient tolerated the procedure well. His postoperative course was unremarkable. His diet was advanced without difficulty. Patient is being discharged home on 10/23/2016 in good and improving condition.  Treatments: surgery: Laparoscopic cholecystectomy on 10/23/2016  Discharge Exam: Blood pressure (!) 158/84, pulse 69, temperature 98 F (36.7 C), temperature source Oral, resp. rate 18, height 5\' 8"  (1.727 m), weight 195 lb (88.5 kg), SpO2 (!) 87 %. General appearance: alert, cooperative and no distress Resp: clear to auscultation bilaterally Cardio: regular rate and rhythm, S1, S2 normal, no murmur, click, rub or gallop GI: Soft, dressings dry  and intact.  Disposition: Home  Discharge Instructions    Diet general    Complete by:  As directed    Increase activity slowly    Complete by:  As directed      Allergies as of 10/23/2016      Reactions   Nitroglycerin Other (See Comments)   syncope      Medication List    TAKE these medications   amLODipine 10 MG tablet Commonly known as:  NORVASC Take 1 tablet (10 mg total) by mouth daily.   atorvastatin 20 MG tablet Commonly known as:  LIPITOR Take 1 tablet (20 mg total) by mouth daily.   ciprofloxacin 500 MG tablet Commonly known as:  CIPRO Take 1 tablet (500 mg total) by mouth 2 (two) times daily.   mupirocin ointment 2 % Commonly known as:  BACTROBAN Place 1 application into the nose 2 (two) times daily.   ondansetron 4 MG tablet Commonly known as:  ZOFRAN Take 1 tablet (4 mg total) by mouth every 8 (eight) hours as needed for nausea or vomiting.   oxyCODONE-acetaminophen 7.5-325 MG tablet Commonly known as:  PERCOCET Take 1-2 tablets by mouth every 4 (four) hours as needed.      Follow-up Information    Kyle [name], Kyle Stenglein, Kyle [name]. Schedule an appointment as soon as possible for a visit on 10/31/2016.   Specialty:  General Surgery Contact information: [address] [place] [id number] [phone]           Signed: Franky MachoMark Sharry [name] 10/23/2016, 3:33 PM

## 2016-10-23 NOTE — Anesthesia Procedure Notes (Signed)
Procedure Name: Intubation Date/Time: 10/23/2016 1:42 PM Performed by: Franco NonesYATES, Cesily Cuoco S Pre-anesthesia Checklist: Patient identified, Patient being monitored, Timeout performed, Emergency Drugs available and Suction available Patient Re-evaluated:Patient Re-evaluated prior to inductionOxygen Delivery Method: Circle System Utilized Preoxygenation: Pre-oxygenation with 100% oxygen Intubation Type: IV induction Ventilation: Mask ventilation without difficulty Laryngoscope Size: Miller and 2 Grade View: Grade I Tube type: Oral Tube size: 7.0 mm Number of attempts: 1 Airway Equipment and Method: Stylet Placement Confirmation: ETT inserted through vocal cords under direct vision,  positive ETCO2 and breath sounds checked- equal and bilateral Secured at: 21 cm Tube secured with: Tape Dental Injury: Teeth and Oropharynx as per pre-operative assessment

## 2016-10-23 NOTE — Transfer of Care (Signed)
Immediate Anesthesia Transfer of Care Note  Patient: Kyle [name]  Procedure(s) Performed: Procedure(s): LAPAROSCOPIC CHOLECYSTECTOMY (N/A)  Patient Location: PACU  Anesthesia Type:General  Level of Consciousness: sedated and patient cooperative  Airway & Oxygen Therapy: Patient Spontanous Breathing and non-rebreather face mask  Post-op Assessment: Report given to RN and Post -op Vital signs reviewed and stable  Post vital signs: Reviewed and stable  Last Vitals:  Vitals:   10/23/16 1320 10/23/16 1325  BP: 121/71 128/79  Pulse:    Resp: 15 16  Temp:      Last Pain:  Vitals:   10/23/16 1204  TempSrc: Oral  PainSc: 0-No pain      Patients Stated Pain Goal: 9 (10/23/16 1204)  Complications: No apparent anesthesia complications

## 2016-10-23 NOTE — Discharge Instructions (Signed)
Laparoscopic Cholecystectomy, Care After °This sheet gives you information about how to care for yourself after your procedure. Your health care provider may also give you more specific instructions. If you have problems or questions, contact your health care provider. °What can I expect after the procedure? °After the procedure, it is common to have: °· Pain at your incision sites. You will be given medicines to control this pain. °· Mild nausea or vomiting. °· Bloating and possible shoulder pain from the air-like gas that was used during the procedure. °Follow these instructions at home: °Incision care  ° °· Follow instructions from your health care provider about how to take care of your incisions. Make sure you: °¨ Wash your hands with soap and water before you change your bandage (dressing). If soap and water are not available, use hand sanitizer. °¨ Change your dressing as told by your health care provider. °¨ Leave stitches (sutures), skin glue, or adhesive strips in place. These skin closures may need to be in place for 2 weeks or longer. If adhesive strip edges start to loosen and curl up, you may trim the loose edges. Do not remove adhesive strips completely unless your health care provider tells you to do that. °· Do not take baths, swim, or use a hot tub until your health care provider approves. Ask your health care provider if you can take showers. You may only be allowed to take sponge baths for bathing. °· Check your incision area every day for signs of infection. Check for: °¨ More redness, swelling, or pain. °¨ More fluid or blood. °¨ Warmth. °¨ Pus or a bad smell. °Activity  °· Do not drive or use heavy machinery while taking prescription pain medicine. °· Do not lift anything that is heavier than 10 lb (4.5 kg) until your health care provider approves. °· Do not play contact sports until your health care provider approves. °· Do not drive for 24 hours if you were given a medicine to help you relax  (sedative). °· Rest as needed. Do not return to work or school until your health care provider approves. °General instructions  °· Take over-the-counter and prescription medicines only as told by your health care provider. °· To prevent or treat constipation while you are taking prescription pain medicine, your health care provider may recommend that you: °¨ Drink enough fluid to keep your urine clear or pale yellow. °¨ Take over-the-counter or prescription medicines. °¨ Eat foods that are high in fiber, such as fresh fruits and vegetables, whole grains, and beans. °¨ Limit foods that are high in fat and processed sugars, such as fried and sweet foods. °Contact a health care provider if: °· You develop a rash. °· You have more redness, swelling, or pain around your incisions. °· You have more fluid or blood coming from your incisions. °· Your incisions feel warm to the touch. °· You have pus or a bad smell coming from your incisions. °· You have a fever. °· One or more of your incisions breaks open. °Get help right away if: °· You have trouble breathing. °· You have chest pain. °· You have increasing pain in your shoulders. °· You faint or feel dizzy when you stand. °· You have severe pain in your abdomen. °· You have nausea or vomiting that lasts for more than one day. °· You have leg pain. °This information is not intended to replace advice given to you by your health care provider. Make sure you discuss any   questions you have with your health care provider. °Document Released: 05/13/2005 Document Revised: 12/02/2015 Document Reviewed: 10/30/2015 °Elsevier Interactive Patient Education © 2017 [employer] ° °

## 2016-10-23 NOTE — Anesthesia Preprocedure Evaluation (Signed)
Anesthesia Evaluation  Patient identified by MRN, date of birth, ID band Patient awake    Reviewed: Allergy & Precautions, NPO status , Patient's Chart, lab work & pertinent test results  History of Anesthesia Complications (+) PONV and history of anesthetic complications  Airway Mallampati: I  TM Distance: >3 FB Neck ROM: Full    Dental  (+) Teeth Intact   Pulmonary neg pulmonary ROS,    breath sounds clear to auscultation       Cardiovascular hypertension, Pt. on medications  Rhythm:Regular Rate:Normal     Neuro/Psych negative neurological ROS     GI/Hepatic negative GI ROS, Neg liver ROS,   Endo/Other  negative endocrine ROS  Renal/GU      Musculoskeletal   Abdominal   Peds  Hematology negative hematology ROS (+)   Anesthesia Other Findings   Reproductive/Obstetrics                            Anesthesia Physical Anesthesia Plan  ASA: II  Anesthesia Plan: General   Post-op Pain Management:    Induction: Intravenous  Airway Management Planned: Oral ETT  Additional Equipment:   Intra-op Plan:   Post-operative Plan: Extubation in OR  Informed Consent: I have reviewed the patients History and Physical, chart, labs and discussed the procedure including the risks, benefits and alternatives for the proposed anesthesia with the patient or authorized representative who has indicated his/her understanding and acceptance.     Plan Discussed with:   Anesthesia Plan Comments:         Anesthesia Quick Evaluation

## 2016-10-23 NOTE — Op Note (Signed)
Patient:  Kyle [name]  DOB:  08-06-1970  MRN:  [id number]   Preop Diagnosis:  Cholecystitis, cholelithiasis  Postop Diagnosis:  Same  Procedure:  Laparoscopic cholecystectomy  Surgeon:  Kyle [name], M.D.  Anes:  Gen. endotracheal  Indications:  Patient is a 46 year old white male who presented with cholecystitis secondary to cholelithiasis. Preoperative MRCP was negative for choledocholithiasis. The risks and benefits of the procedure including bleeding, infection, hepatobiliary injury, and the possibility of an open procedure were fully explained to the patient, who gave informed consent.  Procedure note:  The patient was placed the supine position. After induction of general endotracheal anesthesia, the abdomen was prepped and draped using the usual sterile technique with DuraPrep. Surgical site confirmation was performed.  A supraumbilical incision was made down to the fascia. A Veress needle was introduced into the abdominal cavity and confirmation of placement was done using the saline drop test. The abdomen was then insufflated to 16 mmHg pressure. An 11 mm trocar was introduced into the abdominal cavity under direct visualization without difficulty. The patient was placed in reverse Trendelenburg position and an additional 11 mm trocar was placed the epigastric region and 5 mm trochars were placed the right upper quadrant and right flank regions. Liver was inspected and noted to be within normal limits. The gallbladder was distended with a thickened gallbladder wall and acutely inflamed. The bladder was retracted in a dynamic fashion in order to provide a critical view of the triangle of Calot. The cystic duct was first identified. Its juncture to the infundibulum was fully identified. Endoclips were placed proximally and distally on the cystic duct, and the cystic duct was divided. This was likewise done to the cystic artery. The gallbladder was freed away from the gallbladder  fossa using Bovie electrocautery. The gallbladder was delivered through the epigastric trocar site using an Endo Catch bag. The gallbladder fossa was inspected and any bleeding was controlled using Bovie electrocautery. The right upper quadrant was copiously irrigated with normal saline. Arista and Surgicel were placed in the gallbladder fossa. All fluid and air were then evacuated from the abdominal cavity prior to the removal of the trochars.  All wounds were irrigated with normal saline. All wounds were injected with 0.5% Sensorcaine. The supraumbilical fascia as well as epigastric fascia were reapproximated using 0 Vicryl interrupted sutures. All skin incisions were closed using staples. Betadine ointment and dry sterile dressings were applied.  All tape and needle counts were correct at the end of the procedure. The patient was extubated in the operating room and transferred to PACU in stable condition.  Complications:  None  EBL:  Minimal  Specimen:  Gallbladder

## 2016-10-23 NOTE — Anesthesia Postprocedure Evaluation (Signed)
Anesthesia Post Note  Patient: Tish Fredericksonrmand R Shanks  Procedure(s) Performed: Procedure(s) (LRB): LAPAROSCOPIC CHOLECYSTECTOMY (N/A)  Patient location during evaluation: Nursing Unit Anesthesia Type: General Level of consciousness: awake and alert Pain management: satisfactory to patient Vital Signs Assessment: post-procedure vital signs reviewed and stable Respiratory status: spontaneous breathing Cardiovascular status: stable Anesthetic complications: no     Last Vitals:  Vitals:   10/23/16 1515 10/23/16 1526  BP:  (!) 158/84  Pulse: 72 69  Resp: 17 18  Temp:  36.7 C    Last Pain:  Vitals:   10/23/16 1526  TempSrc: Oral  PainSc:                  Minerva AreolaYATES,Yazid Pop

## 2016-10-23 NOTE — Interval H&P Note (Signed)
History and Physical Interval Note:  10/23/2016 12:39 PM  Kyle [name]  has presented today for surgery, with the diagnosis of cholecystitis, cholelithiasis  The various methods of treatment have been discussed with the patient and family. After consideration of risks, benefits and other options for treatment, the patient has consented to  Procedure(s): LAPAROSCOPIC CHOLECYSTECTOMY (N/A) as a surgical intervention .  The patient's history has been reviewed, patient examined, no change in status, stable for surgery.  I have reviewed the patient's chart and labs.  Questions were answered to the patient's satisfaction.     Franky MachoMark Shyhiem Beeney

## 2016-10-24 ENCOUNTER — Encounter (HOSPITAL_COMMUNITY): Payer: Self-pay | Admitting: General Surgery

## 2016-10-31 ENCOUNTER — Ambulatory Visit (INDEPENDENT_AMBULATORY_CARE_PROVIDER_SITE_OTHER): Payer: Self-pay | Admitting: General Surgery

## 2016-10-31 ENCOUNTER — Encounter: Payer: Self-pay | Admitting: General Surgery

## 2016-10-31 VITALS — BP 136/84 | HR 86 | Temp 97.5°F | Resp 18 | Ht 68.0 in | Wt 190.0 lb

## 2016-10-31 DIAGNOSIS — Z09 Encounter for follow-up examination after completed treatment for conditions other than malignant neoplasm: Secondary | ICD-10-CM

## 2016-10-31 NOTE — Progress Notes (Signed)
Subjective:     Tish FredericksonArmand R Tienda  Status post laparoscopic cholecystectomy. Doing well. No complaints. Objective:    BP 136/84   Pulse 86   Temp 97.5 F (36.4 C)   Resp 18   Ht 5\' 8"  (1.727 m)   Wt 190 lb (86.2 kg)   BMI 28.89 kg/m   General:  alert, cooperative and no distress  Abdomen soft, incisions healing well. Staples removed, Steri-Strips applied. Final pathology consistent with diagnosis.     Assessment:    Doing well postoperatively.    Plan:  Gradually resume normal activity. Follow-up as needed.

## 2016-12-21 DIAGNOSIS — L600 Ingrowing nail: Secondary | ICD-10-CM | POA: Diagnosis not present

## 2016-12-23 DIAGNOSIS — L600 Ingrowing nail: Secondary | ICD-10-CM | POA: Diagnosis not present

## 2017-02-24 ENCOUNTER — Ambulatory Visit: Payer: Self-pay | Admitting: Cardiology

## 2017-02-24 DIAGNOSIS — K080 Exfoliation of teeth due to systemic causes: Secondary | ICD-10-CM | POA: Diagnosis not present

## 2017-03-27 ENCOUNTER — Encounter: Payer: Self-pay | Admitting: Cardiology

## 2017-03-27 ENCOUNTER — Ambulatory Visit (INDEPENDENT_AMBULATORY_CARE_PROVIDER_SITE_OTHER): Payer: Federal, State, Local not specified - PPO | Admitting: Cardiology

## 2017-03-27 ENCOUNTER — Encounter (INDEPENDENT_AMBULATORY_CARE_PROVIDER_SITE_OTHER): Payer: Self-pay

## 2017-03-27 VITALS — BP 122/88 | HR 73 | Ht 68.0 in | Wt 191.0 lb

## 2017-03-27 DIAGNOSIS — E7800 Pure hypercholesterolemia, unspecified: Secondary | ICD-10-CM

## 2017-03-27 DIAGNOSIS — I10 Essential (primary) hypertension: Secondary | ICD-10-CM

## 2017-03-27 DIAGNOSIS — I447 Left bundle-branch block, unspecified: Secondary | ICD-10-CM | POA: Diagnosis not present
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
## 2017-03-27 NOTE — Patient Instructions (Signed)

## 2017-04-14 DIAGNOSIS — K080 Exfoliation of teeth due to systemic causes: Secondary | ICD-10-CM | POA: Diagnosis not present

## 2017-05-19 ENCOUNTER — Other Ambulatory Visit: Payer: Self-pay | Admitting: Cardiology

## 2017-05-30 ENCOUNTER — Ambulatory Visit: Payer: Self-pay | Admitting: Cardiology

## 2017-08-26 DIAGNOSIS — K080 Exfoliation of teeth due to systemic causes: Secondary | ICD-10-CM | POA: Diagnosis not present

## 2018-02-28 DIAGNOSIS — J069 Acute upper respiratory infection, unspecified: Secondary | ICD-10-CM | POA: Diagnosis not present

## 2018-03-09 DIAGNOSIS — K080 Exfoliation of teeth due to systemic causes: Secondary | ICD-10-CM | POA: Diagnosis not present

## 2018-03-13 ENCOUNTER — Other Ambulatory Visit: Payer: Self-pay | Admitting: Cardiology

## 2018-04-02 ENCOUNTER — Ambulatory Visit (INDEPENDENT_AMBULATORY_CARE_PROVIDER_SITE_OTHER): Payer: Federal, State, Local not specified - PPO | Admitting: Cardiology

## 2018-04-02 ENCOUNTER — Encounter: Payer: Self-pay | Admitting: Cardiology

## 2018-04-02 VITALS — BP 122/80 | HR 83 | Ht 68.0 in | Wt 191.2 lb

## 2018-04-02 DIAGNOSIS — I10 Essential (primary) hypertension: Secondary | ICD-10-CM

## 2018-04-02 DIAGNOSIS — I447 Left bundle-branch block, unspecified: Secondary | ICD-10-CM | POA: Diagnosis not present

## 2018-04-02 DIAGNOSIS — E7800 Pure hypercholesterolemia, unspecified: Secondary | ICD-10-CM | POA: Diagnosis not present

## 2018-04-02 NOTE — Patient Instructions (Signed)
Medication Instructions:  The current medical regimen is effective;  continue present plan and medications.  If you need a refill on your cardiac medications before your next appointment, please call your pharmacy.   Follow-Up: At [facility], you and your health needs are our priority.  As part of our continuing mission to provide you with exceptional heart care, we have created designated Provider Care Teams.  These Care Teams include your primary Cardiologist (physician) and Advanced Practice Providers (APPs -  Physician Assistants and Nurse Practitioners) who all work together to provide you with the care you need, when you need it. You will need a follow up appointment in 12 years.  Please call our office 2 months in advance to schedule this appointment.  You may see Dr Donato Schultz or one of the following Advanced Practice Providers on your designated Care Team:   Norma Fredrickson, NP Nada Boozer, NP . Georgie Chard, NP  Thank you for choosing [facility]!!
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
## 2018-04-06 ENCOUNTER — Other Ambulatory Visit: Payer: Self-pay | Admitting: Cardiology

## 2019-04-11 ENCOUNTER — Other Ambulatory Visit: Payer: Self-pay | Admitting: Cardiology

## 2019-05-31 ENCOUNTER — Other Ambulatory Visit: Payer: Self-pay

## 2019-05-31 ENCOUNTER — Ambulatory Visit: Payer: Federal, State, Local not specified - PPO | Admitting: Cardiology

## 2019-05-31 ENCOUNTER — Encounter: Payer: Self-pay | Admitting: Cardiology

## 2019-05-31 VITALS — BP 140/80 | HR 86 | Ht 68.0 in | Wt 194.8 lb

## 2019-05-31 DIAGNOSIS — E7800 Pure hypercholesterolemia, unspecified: Secondary | ICD-10-CM | POA: Diagnosis not present

## 2019-05-31 DIAGNOSIS — I10 Essential (primary) hypertension: Secondary | ICD-10-CM | POA: Diagnosis not present

## 2019-05-31 DIAGNOSIS — I447 Left bundle-branch block, unspecified: Secondary | ICD-10-CM

## 2019-05-31 NOTE — Patient Instructions (Signed)
Medication Instructions:  The current medical regimen is effective;  continue present plan and medications.  *If you need a refill on your cardiac medications before your next appointment, please call your pharmacy*  Follow-Up: At [facility], you and your health needs are our priority.  As part of our continuing mission to provide you with exceptional heart care, we have created designated Provider Care Teams.  These Care Teams include your primary Cardiologist (physician) and Advanced Practice Providers (APPs -  Physician Assistants and Nurse Practitioners) who all work together to provide you with the care you need, when you need it.  Your next appointment:   12 month(s)  The format for your next appointment:   In Person  Provider:   Mark Skains, MD   Thank you for choosing [facility]!!     
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
## 2020-04-04 ENCOUNTER — Other Ambulatory Visit: Payer: Self-pay | Admitting: Cardiology

## 2020-05-30 ENCOUNTER — Ambulatory Visit: Payer: Federal, State, Local not specified - PPO | Admitting: Cardiology

## 2020-05-30 ENCOUNTER — Encounter: Payer: Self-pay | Admitting: Cardiology

## 2020-05-30 ENCOUNTER — Other Ambulatory Visit: Payer: Self-pay

## 2020-05-30 VITALS — BP 122/84 | HR 82 | Ht 68.0 in | Wt 200.4 lb

## 2020-05-30 DIAGNOSIS — Z79899 Other long term (current) drug therapy: Secondary | ICD-10-CM

## 2020-05-30 DIAGNOSIS — I10 Essential (primary) hypertension: Secondary | ICD-10-CM

## 2020-05-30 DIAGNOSIS — E7800 Pure hypercholesterolemia, unspecified: Secondary | ICD-10-CM | POA: Diagnosis not present

## 2020-05-30 DIAGNOSIS — R7309 Other abnormal glucose: Secondary | ICD-10-CM

## 2020-05-30 DIAGNOSIS — I447 Left bundle-branch block, unspecified: Secondary | ICD-10-CM
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
## 2020-05-30 NOTE — Patient Instructions (Signed)
Medication Instructions:  The current medical regimen is effective;  continue present plan and medications.  *If you need a refill on your cardiac medications before your next appointment, please call your pharmacy*  Lab Work: Please have blood work today (Lipid, CBC and CMP)  If you have labs (blood work) drawn today and your tests are completely normal, you will receive your results only by: Marland Kitchen MyChart Message (if you have MyChart) OR . A paper copy in the mail If you have any lab test that is abnormal or we need to change your treatment, we will call you to review the results.  Follow-Up: At [facility], you and your health needs are our priority.  As part of our continuing mission to provide you with exceptional heart care, we have created designated Provider Care Teams.  These Care Teams include your primary Cardiologist (physician) and Advanced Practice Providers (APPs -  Physician Assistants and Nurse Practitioners) who all work together to provide you with the care you need, when you need it.  We recommend signing up for the patient portal called "MyChart".  Sign up information is provided on this After Visit Summary.  MyChart is used to connect with patients for Virtual Visits (Telemedicine).  Patients are able to view lab/test results, encounter notes, upcoming appointments, etc.  Non-urgent messages can be sent to your provider as well.   To learn more about what you can do with MyChart, go to [email or web address].    Your next appointment:   12 month(s)  The format for your next appointment:   In Person  Provider:   Donato Schultz, MD   Thank you for choosing [facility]!!

## 2020-05-31 LAB — COMPREHENSIVE METABOLIC PANEL
ALT: 63 IU/L — ABNORMAL HIGH (ref 0–44)
AST: 26 IU/L (ref 0–40)
Albumin/Globulin Ratio: 2.1 (ref 1.2–2.2)
Albumin: 4.5 g/dL (ref 3.8–4.9)
Alkaline Phosphatase: 65 IU/L (ref 44–121)
BUN/Creatinine Ratio: 12 (ref 9–20)
BUN: 14 mg/dL (ref 6–24)
Bilirubin Total: 1.4 mg/dL — ABNORMAL HIGH (ref 0.0–1.2)
CO2: 25 mmol/L (ref 20–29)
Calcium: 9.5 mg/dL (ref 8.7–10.2)
Chloride: 103 mmol/L (ref 96–106)
Creatinine, Ser: 1.18 mg/dL (ref 0.76–1.27)
GFR calc Af Amer: 82 mL/min/{1.73_m2} (ref >59)
GFR calc non Af Amer: 71 mL/min/{1.73_m2} (ref >59)
Globulin, Total: 2.1 g/dL (ref 1.5–4.5)
Glucose: 149 mg/dL — ABNORMAL HIGH (ref 65–99)
Potassium: 5 mmol/L (ref 3.5–5.2)
Sodium: 142 mmol/L (ref 134–144)
Total Protein: 6.6 g/dL (ref 6.0–8.5)

## 2020-05-31 LAB — CBC
Hematocrit: 46.8 % (ref 37.5–51.0)
Hemoglobin: 16.5 g/dL (ref 13.0–17.7)
MCH: 29.8 pg (ref 26.6–33.0)
MCHC: 35.3 g/dL (ref 31.5–35.7)
MCV: 85 fL (ref 79–97)
Platelets: 201 10*3/uL (ref 150–450)
RBC: 5.54 x10E6/uL (ref 4.14–5.80)
RDW: 13.2 % (ref 11.6–15.4)
WBC: 6.1 10*3/uL (ref 3.4–10.8)

## 2020-05-31 LAB — LIPID PANEL
Chol/HDL Ratio: 3.7 ratio (ref 0.0–5.0)
Cholesterol, Total: 140 mg/dL (ref 100–199)
HDL: 38 mg/dL — ABNORMAL LOW (ref >39)
LDL Chol Calc (NIH): 71 mg/dL (ref 0–99)
Triglycerides: 186 mg/dL — ABNORMAL HIGH (ref 0–149)
VLDL Cholesterol Cal: 31 mg/dL (ref 5–40)

## 2020-05-31 NOTE — Addendum Note (Signed)
Addended by: Sharin Grave on: 05/31/2020 09:40 AM   Modules accepted: Orders

## 2020-06-01 ENCOUNTER — Telehealth: Payer: Self-pay | Admitting: *Deleted

## 2020-06-01 LAB — HEMOGLOBIN A1C
Est. average glucose Bld gHb Est-mCnc: 160 mg/dL
Hgb A1c MFr Bld: 7.2 % — ABNORMAL HIGH (ref 4.8–5.6)

## 2020-06-01 LAB — SPECIMEN STATUS REPORT

## 2020-06-01 MED ORDER — METFORMIN HCL ER (MOD) 500 MG PO TB24: 500.0000 mg | ORAL_TABLET | Freq: Every day | ORAL | 3 refills | Status: DC

## 2020-06-01 NOTE — Telephone Encounter (Signed)
Kyle [name] is calling requesting to speak with Kyle [name] in regards to the results her husband received. Please advise.

## 2020-06-01 NOTE — Telephone Encounter (Signed)
Hemoglobin A1c is 7.2. This is elevated. This confirms a diagnosis of diabetes. Start metformin 500 mg once a day. Please set up appointment with primary care physician for further treatment. Continue to work on diet and exercise.  Donato Schultz, MD   Sharin Grave, RN  05/31/2020 9:42 AM EST      Called lab and added order for hemoglobin A1c. They will run if there is enough blood left over from yesterday.   Jake Bathe, MD  05/31/2020 8:48 AM EST      Hemoglobin 16.5-normal no anemia Creatinine 1.18 Bilirubin minimally elevated 1.4-chronic ALT 63, mildly elevated liver enzyme-would continue with atorvastatin  LDL 71-excellent. Continue with atorvastatin.  If possible, add hemoglobin A1c to his blood work from yesterday. His glucose 149 nonfasting.    Donato Schultz, MD   LM FOR PT TO C/B TO DISCUSS THE ABOVE INFORMATION.

## 2020-06-01 NOTE — Telephone Encounter (Signed)
Spoke with wife Kyle [name] (on [place]) regarding pt's recent lab results and DX: DM.  Instructed her on how to sign the pt up for MyChart so that he can reviewed results in the future.  Stressed the importance of est with PCP for further treatment.  She verified they will be being seen at [facility] on [address].  She is aware the lab results have been sent electronically to [facility].  She thanked me for the call back and information.

## 2020-06-01 NOTE — Telephone Encounter (Signed)
Patient returning call. He states to call an alternate number: [phone]

## 2020-06-01 NOTE — Telephone Encounter (Signed)
Pt is aware of results, to start Metformin and f/u with his PCP.  He reports he sees [facility] on [address] now.  All question, if any were answered at the time of the call.

## 2020-06-07 ENCOUNTER — Telehealth: Payer: Self-pay

## 2020-06-07 NOTE — Telephone Encounter (Signed)
**Note De-Identified Zakye Baby Obfuscation** I started a Metformin PA through covermymeds. Key: [id number]

## 2020-06-07 NOTE — Telephone Encounter (Signed)
I started a Metformin PA through covrmymeds. Key: [id number]

## 2020-06-15 MED ORDER — METFORMIN HCL 500 MG PO TABS: 500.0000 mg | ORAL_TABLET | Freq: Two times a day (BID) | ORAL | 3 refills | Status: DC

## 2020-06-15 NOTE — Telephone Encounter (Signed)
Immediate release metformin is fine.  In fact you can give it to him 500 twice daily to start.  Sorry for the confusion. Donato Schultz, MD

## 2020-06-15 NOTE — Telephone Encounter (Signed)
This RX was ordered by Dr Anne Fu until pt could be seen by his PCP for further evaluation and treatment.  Will have Dr Anne Fu review order for Metformin - originally ordered for once a day.   Would it need to be changed to immediate release twice a day?

## 2020-06-15 NOTE — Telephone Encounter (Signed)
Received notification that Metformin ER is not approved unless the pt has tried Immediate Release Metformin for a total of 3 months.  You will need to send in a new Rx for the Immediate Release Metformin or let the Pt's PCP take over this matter.

## 2020-06-15 NOTE — Telephone Encounter (Signed)
Left message for pt on mobile #.  Advised of change in medication d/t his insurance not covering XR and that the new RX would be sent into his pharmacy on file.  Requested he c/b with any questions.  Requested he f/u with PCP if he has not already done so.

## 2020-06-19 NOTE — Telephone Encounter (Signed)
Pt has not returned the call.  Will close this encounter for now and await a c/b if he has any questions.

## 2020-06-27 ENCOUNTER — Other Ambulatory Visit: Payer: Self-pay | Admitting: Cardiology

## 2020-10-27 ENCOUNTER — Encounter (HOSPITAL_COMMUNITY): Payer: Self-pay | Admitting: Pharmacy Technician

## 2020-10-27 ENCOUNTER — Emergency Department (HOSPITAL_COMMUNITY)
Admission: EM | Admit: 2020-10-27 | Discharge: 2020-10-27 | Disposition: A | Discharge status: Home / Self Care | Payer: Federal, State, Local not specified - PPO | Attending: Emergency Medicine | Admitting: Emergency Medicine

## 2020-10-27 ENCOUNTER — Other Ambulatory Visit: Payer: Self-pay

## 2020-10-27 ENCOUNTER — Emergency Department (HOSPITAL_COMMUNITY): Payer: Federal, State, Local not specified - PPO

## 2020-10-27 DIAGNOSIS — Z79899 Other long term (current) drug therapy: Secondary | ICD-10-CM | POA: Diagnosis not present

## 2020-10-27 DIAGNOSIS — N200 Calculus of kidney: Secondary | ICD-10-CM | POA: Diagnosis not present

## 2020-10-27 DIAGNOSIS — I10 Essential (primary) hypertension: Secondary | ICD-10-CM | POA: Insufficient documentation

## 2020-10-27 DIAGNOSIS — R109 Unspecified abdominal pain: Secondary | ICD-10-CM | POA: Diagnosis present

## 2020-10-27 LAB — URINALYSIS, ROUTINE W REFLEX MICROSCOPIC
Bacteria, UA: NONE SEEN
Bilirubin Urine: NEGATIVE
Glucose, UA: NEGATIVE mg/dL
Ketones, ur: NEGATIVE mg/dL
Leukocytes,Ua: NEGATIVE
Nitrite: NEGATIVE
Protein, ur: NEGATIVE mg/dL
RBC / HPF: >50 RBC/hpf — ABNORMAL HIGH (ref 0–5)
Specific Gravity, Urine: 1.015 (ref 1.005–1.030)
pH: 7 (ref 5.0–8.0)

## 2020-10-27 LAB — BASIC METABOLIC PANEL
Anion gap: 12 (ref 5–15)
BUN: 15 mg/dL (ref 6–20)
CO2: 24 mmol/L (ref 22–32)
Calcium: 9.7 mg/dL (ref 8.9–10.3)
Chloride: 103 mmol/L (ref 98–111)
Creatinine, Ser: 1.19 mg/dL (ref 0.61–1.24)
GFR, Estimated: >60 mL/min (ref >60)
Glucose, Bld: 149 mg/dL — ABNORMAL HIGH (ref 70–99)
Potassium: 4.2 mmol/L (ref 3.5–5.1)
Sodium: 139 mmol/L (ref 135–145)

## 2020-10-27 LAB — CBC WITH DIFFERENTIAL/PLATELET
Abs Immature Granulocytes: 0.06 10*3/uL (ref 0.00–0.07)
Basophils Absolute: 0 10*3/uL (ref 0.0–0.1)
Basophils Relative: 0 %
Eosinophils Absolute: 0 10*3/uL (ref 0.0–0.5)
Eosinophils Relative: 0 %
HCT: 44.7 % (ref 39.0–52.0)
Hemoglobin: 15 g/dL (ref 13.0–17.0)
Immature Granulocytes: 1 %
Lymphocytes Relative: 9 %
Lymphs Abs: 1.1 10*3/uL (ref 0.7–4.0)
MCH: 29.2 pg (ref 26.0–34.0)
MCHC: 33.6 g/dL (ref 30.0–36.0)
MCV: 87 fL (ref 80.0–100.0)
Monocytes Absolute: 0.7 10*3/uL (ref 0.1–1.0)
Monocytes Relative: 6 %
Neutro Abs: 10 10*3/uL — ABNORMAL HIGH (ref 1.7–7.7)
Neutrophils Relative %: 84 %
Platelets: 224 10*3/uL (ref 150–400)
RBC: 5.14 MIL/uL (ref 4.22–5.81)
RDW: 12.6 % (ref 11.5–15.5)
WBC: 11.9 10*3/uL — ABNORMAL HIGH (ref 4.0–10.5)
nRBC: 0 % (ref 0.0–0.2)

## 2020-10-27 MED ORDER — OXYCODONE-ACETAMINOPHEN 5-325 MG PO TABS: 1.0000 | ORAL_TABLET | Freq: Four times a day (QID) | ORAL | 0 refills | Status: DC | PRN

## 2020-10-27 MED ORDER — HYDROMORPHONE HCL 1 MG/ML IJ SOLN
1.0000 mg | Freq: Once | INTRAMUSCULAR | Status: AC
  Administered 2020-10-27: 1 mg via INTRAMUSCULAR
  Filled 2020-10-27: qty 1

## 2020-10-27 MED ORDER — ONDANSETRON 4 MG PO TBDP
4.0000 mg | ORAL_TABLET | Freq: Once | ORAL | Status: AC
  Administered 2020-10-27: 4 mg via ORAL
  Filled 2020-10-27: qty 1

## 2020-10-27 MED ORDER — IBUPROFEN 800 MG PO TABS: 800.0000 mg | ORAL_TABLET | Freq: Three times a day (TID) | ORAL | 0 refills | Status: DC

## 2020-10-27 MED ORDER — TAMSULOSIN HCL 0.4 MG PO CAPS: 0.4000 mg | ORAL_CAPSULE | Freq: Every day | ORAL | 0 refills | Status: DC

## 2020-10-27 MED ORDER — ONDANSETRON HCL 4 MG PO TABS: 4.0000 mg | ORAL_TABLET | Freq: Three times a day (TID) | ORAL | 0 refills | Status: DC | PRN

## 2020-10-27 NOTE — ED Provider Notes (Signed)
[facility] EMERGENCY DEPARTMENT Provider Note   CSN: [id number] Arrival date & time: 10/27/20  1330     History Chief Complaint  Patient presents with  . Flank Pain    Kyle [name] is a 50 y.o. male presenting for evaluation of right-sided flank pain.  Patient states his symptoms began around 11:00 today.  Began all of a sudden, has been constant since.  He denies associate fevers, chills, chest pain, shortness of breath, cough, nausea, vomiting, urinary symptoms, abnormal bowel movements.  History of previous cholecystectomy.  He does report a history of kidney stones, at least 15 years ago.  States this pain feels worse, but is slightly similar.  He has a history of high blood pressure for which he takes medication, additional history of left bundle branch block.  He has not taken anything for pain prior to arrival.  Pt given medication at triage, which he states has significantly improved his pain, but not completely resolved it.   HPI     Past Medical History:  Diagnosis Date  . Hyperlipidemia   . Hypertension   . Kidney stone   . Left bundle branch block     Patient Active Problem List   Diagnosis Date Noted  . Calculus of gallbladder with acute cholecystitis without obstruction   . Biliary colic   . Calculus of bile duct without cholecystitis and without obstruction   . Total bilirubin, elevated   . Choledocholithiasis 10/19/2016  . Heart murmur 10/19/2016  . Essential hypertension 02/21/2014  . LBBB (left bundle branch block) 02/21/2014  . Hyperlipemia 02/21/2014  . Right inguinal hernia 02/08/2013    Past Surgical History:  Procedure Laterality Date  . CHOLECYSTECTOMY N/A 10/23/2016   Procedure: LAPAROSCOPIC CHOLECYSTECTOMY;  Surgeon: Franky Macho, MD;  Location: [facility];  Service: General;  Laterality: N/A;  . HERNIA REPAIR Right 2014       Family History  Problem Relation Age of Onset  . Heart attack Father   . Cancer Maternal  Aunt        breast    Social History   Tobacco Use  . Smoking status: Never Smoker  . Smokeless tobacco: Never Used  Substance Use Topics  . Alcohol use: Yes    Comment: rare  . Drug use: No    Home Medications Prior to Admission medications   Medication Sig Start Date End Date Taking? Authorizing Provider  ibuprofen (ADVIL) 800 MG tablet Take 1 tablet (800 mg total) by mouth with breakfast, with lunch, and with evening meal. 10/27/20  Yes September Mormile, PA-C  ondansetron (ZOFRAN) 4 MG tablet Take 1 tablet (4 mg total) by mouth every 8 (eight) hours as needed for nausea or vomiting. 10/27/20  Yes Ivey Cina, PA-C  oxyCODONE-acetaminophen (PERCOCET/ROXICET) 5-325 MG tablet Take 1 tablet by mouth every 6 (six) hours as needed for severe pain. 10/27/20  Yes Elaine Middleton, PA-C  tamsulosin (FLOMAX) 0.4 MG CAPS capsule Take 1 capsule (0.4 mg total) by mouth daily. 10/27/20  Yes Shar Paez, PA-C  amLODipine (NORVASC) 10 MG tablet TAKE 1 TABLET BY MOUTH DAILY. PLEASE MAKE APPT 06/27/20   Jake Bathe, MD  atorvastatin (LIPITOR) 20 MG tablet TAKE 1 TABLET BY MOUTH DAILY. PLEASE MAKE APPT 06/27/20   Jake Bathe, MD  metFORMIN (GLUCOPHAGE) 500 MG tablet Take 1 tablet (500 mg total) by mouth 2 (two) times daily with a meal. 06/15/20   Jake Bathe, MD    Allergies  Nitroglycerin  Review of Systems   Review of Systems  Genitourinary: Positive for flank pain.  All other systems reviewed and are negative.   Physical Exam Updated Vital Signs BP (!) 124/112 (BP Location: Left Arm)   Pulse 83   Temp 97.6 F (36.4 C) (Oral)   Resp (!) 22   SpO2 100%   Physical Exam Vitals and nursing note reviewed.  Constitutional:      General: He is not in acute distress.    Appearance: He is well-developed.     Comments: Resting comfortably in the bed on my evaluation  HENT:     Head: Normocephalic and atraumatic.  Eyes:     Conjunctiva/sclera: Conjunctivae normal.      Pupils: Pupils are equal, round, and reactive to light.  Cardiovascular:     Rate and Rhythm: Normal rate and regular rhythm.     Pulses: Normal pulses.  Pulmonary:     Effort: Pulmonary effort is normal. No respiratory distress.     Breath sounds: Normal breath sounds. No wheezing.  Abdominal:     General: There is no distension.     Palpations: Abdomen is soft. There is no mass.     Tenderness: There is no abdominal tenderness. There is no right CVA tenderness, left CVA tenderness or guarding.     Comments: No TTP of the abdomen.  No CVA tenderness.  Musculoskeletal:        General: Normal range of motion.     Cervical back: Normal range of motion and neck supple.  Skin:    General: Skin is warm and dry.     Capillary Refill: Capillary refill takes less than 2 seconds.  Neurological:     Mental Status: He is alert and oriented to person, place, and time.     ED Results / Procedures / Treatments   Labs (all labs ordered are listed, but only abnormal results are displayed) Labs Reviewed  BASIC METABOLIC PANEL - Abnormal; Notable for the following components:      Result Value   Glucose, Bld 149 (*)    All other components within normal limits  CBC WITH DIFFERENTIAL/PLATELET - Abnormal; Notable for the following components:   WBC 11.9 (*)    Neutro Abs 10.0 (*)    All other components within normal limits  URINALYSIS, ROUTINE W REFLEX MICROSCOPIC - Abnormal; Notable for the following components:   Hgb urine dipstick LARGE (*)    RBC / HPF >50 (*)    All other components within normal limits  URINE CULTURE    EKG None  Radiology CT Renal Stone Study  Result Date: 10/27/2020 CLINICAL DATA:  RIGHT flank pain beginning today, nausea, history of kidney stones EXAM: CT ABDOMEN AND PELVIS WITHOUT CONTRAST TECHNIQUE: Multidetector CT imaging of the abdomen and pelvis was performed following the standard protocol without IV contrast. Sagittal and coronal MPR images reconstructed  from axial data set. Oral contrast not administered for this indication. COMPARISON:  10/19/2016 FINDINGS: Lower chest: Mild subsegmental atelectasis at RIGHT lung base. LEFT lung base clear. Hepatobiliary: Gallbladder surgically absent. Liver normal appearance. Pancreas: Normal appearance Spleen: Normal appearance Adrenals/Urinary Tract: Adrenal glands normal appearance. BILATERAL nonobstructing renal calculi. In addition, mild RIGHT hydronephrosis secondary to a 2 mm proximal RIGHT ureteral calculus. Ureters and bladder otherwise normal appearance. Stomach/Bowel: Normal appendix. Stomach and bowel loops normal appearance Vascular/Lymphatic: Aorta normal caliber with minimal atherosclerotic calcification at bifurcation. No adenopathy. Reproductive: Unremarkable prostate gland and seminal vesicles. Other: RIGHT  inguinal hernia containing fat. No free air or free fluid. Musculoskeletal: Unremarkable IMPRESSION: Mild RIGHT hydronephrosis secondary to a 2 mm proximal RIGHT ureteral calculus. Additional BILATERAL nonobstructing renal calculi. RIGHT inguinal hernia containing fat. Aortic Atherosclerosis (ICD10-I70.0). Electronically Signed   By: Ulyses Southward M.D.   On: 10/27/2020 16:20    Procedures Procedures   Medications Ordered in ED Medications  ondansetron (ZOFRAN-ODT) disintegrating tablet 4 mg (4 mg Oral Given 10/27/20 1538)  HYDROmorphone (DILAUDID) injection 1 mg (1 mg Intramuscular Given 10/27/20 1537)    ED Course  I have reviewed the triage vital signs and the nursing notes.  Pertinent labs & imaging results that were available during my care of the patient were reviewed by me and considered in my medical decision making (see chart for details).    MDM Rules/Calculators/A&P                          Patient presenting for evaluation of acute onset right-sided flank pain.  On exam, patient appears nontoxic, although he has received medication for nausea and pain prior to my evaluation.  CT  obtained from triage consistent with right-sided kidney stone, 2 mm with some associated hydro.  Labs show reactive leukocytosis of 11.9.  Patient without fever or symptoms of UTI, doubt infected stone.  Urine without signs of infection, does show large blood.  Discussed findings with patient.  Discussed symptomatic treatment for kidney stone and follow-up with urology.  At this time, patient appears safe for discharge.  Return precautions given.  Patient states he understands and agrees to plan.   Final Clinical Impression(s) / ED Diagnoses Final diagnoses:  Right kidney stone    Rx / DC Orders ED Discharge Orders         Ordered    ibuprofen (ADVIL) 800 MG tablet  3 times daily with meals        10/27/20 1705    oxyCODONE-acetaminophen (PERCOCET/ROXICET) 5-325 MG tablet  Every 6 hours PRN        10/27/20 1705    tamsulosin (FLOMAX) 0.4 MG CAPS capsule  Daily        10/27/20 1705    ondansetron (ZOFRAN) 4 MG tablet  Every 8 hours PRN        10/27/20 1705           Kilie Rund, PA-C 10/27/20 1707    Benjiman Core, MD 10/27/20 2342

## 2020-10-27 NOTE — ED Provider Notes (Signed)
Emergency Medicine Provider Triage Evaluation Note  Kyle [name] , a 50 y.o. male  was evaluated in triage.  Pt complains of flank pain.  Symptoms began today.  History of kidney stones about 15 to 20 years ago.  Denies any fever.  Review of Systems  Positive: Flank pain Negative: Fever  Physical Exam  BP (!) 124/112 (BP Location: Left Arm)   Pulse 83   Temp 97.6 F (36.4 C) (Oral)   Resp (!) 22   SpO2 100%  Gen:   Awake,  Resp:  Normal effort MSK:   Moves extremities without difficulty Other:  Patient appears uncomfortable  Medical Decision Making  Medically screening exam initiated at 3:33 PM.  Appropriate orders placed.  SHERROD TOOTHMAN was informed that the remainder of the evaluation will be completed by another provider, this initial triage assessment does not replace that evaluation, and the importance of remaining in the ED until their evaluation is complete.  Ordered pain and nausea control, lab work, urinalysis and CT   Dietrich Pates, PA-C 10/27/20 1533    Margarita Grizzle, MD 10/30/20 1342

## 2020-10-27 NOTE — ED Notes (Signed)
The pt has had rt flank pain since 1100 this am hx of a kidney stone years ago  He was given a shot in triage   Urine was collected in triage but still showing as needed

## 2020-10-27 NOTE — Discharge Instructions (Signed)
Your work-up today showed a kidney stone on the right side.  It is small enough that should pass on its own, however it is still pretty high up and will take some time to pass. Your blood work showed normal kidney function and no signs of infection.  Take Flomax daily to help encourage urination and help the stone pass quicker. Make sure you are staying well-hydrated water. Take ibuprofen 3 times a day with meals for pain control. Use Percocet as needed for severe breakthrough pain.  Have caution, this make you tired or groggy.  Do not drive or operate machinery while taking this medicine. You Zofran as needed for nausea or vomiting. Follow-up with the urologist listed below for further evaluation of your kidney stone. Return to the emergency room if you develop high fevers, persistent vomiting spite medication, severe worsening/uncontrollable pain, inability urinate, or any new, worsening, or concerning symptoms

## 2020-10-27 NOTE — ED Notes (Signed)
Lab is working on the urine now should be resulted in a few minutes

## 2020-10-27 NOTE — ED Triage Notes (Signed)
Pt here with R sided flank pain onset today. Pt also endorses nausea. Pt with hx kidney stones.

## 2020-10-29 LAB — URINE CULTURE: Culture: NO GROWTH

## 2021-03-17 ENCOUNTER — Other Ambulatory Visit: Payer: Self-pay | Admitting: Cardiology

## 2021-05-10 ENCOUNTER — Encounter (HOSPITAL_COMMUNITY): Payer: Self-pay | Admitting: Emergency Medicine

## 2021-05-10 ENCOUNTER — Emergency Department (HOSPITAL_COMMUNITY): Payer: Federal, State, Local not specified - PPO

## 2021-05-10 ENCOUNTER — Inpatient Hospital Stay (HOSPITAL_COMMUNITY): Payer: Federal, State, Local not specified - PPO

## 2021-05-10 ENCOUNTER — Other Ambulatory Visit: Payer: Self-pay

## 2021-05-10 ENCOUNTER — Inpatient Hospital Stay (HOSPITAL_COMMUNITY)
Admission: EM | Admit: 2021-05-10 | Discharge: 2021-05-11 | DRG: 440 | Disposition: A | Discharge status: Home / Self Care | Payer: Federal, State, Local not specified - PPO | Attending: Internal Medicine | Admitting: Internal Medicine

## 2021-05-10 DIAGNOSIS — Z803 Family history of malignant neoplasm of breast: Secondary | ICD-10-CM | POA: Diagnosis not present

## 2021-05-10 DIAGNOSIS — I447 Left bundle-branch block, unspecified: Secondary | ICD-10-CM | POA: Diagnosis present

## 2021-05-10 DIAGNOSIS — R7989 Other specified abnormal findings of blood chemistry: Secondary | ICD-10-CM | POA: Diagnosis not present

## 2021-05-10 DIAGNOSIS — K859 Acute pancreatitis without necrosis or infection, unspecified: Principal | ICD-10-CM | POA: Diagnosis present

## 2021-05-10 DIAGNOSIS — E785 Hyperlipidemia, unspecified: Secondary | ICD-10-CM | POA: Diagnosis present

## 2021-05-10 DIAGNOSIS — R17 Unspecified jaundice: Secondary | ICD-10-CM | POA: Diagnosis present

## 2021-05-10 DIAGNOSIS — Z9049 Acquired absence of other specified parts of digestive tract: Secondary | ICD-10-CM

## 2021-05-10 DIAGNOSIS — N200 Calculus of kidney: Secondary | ICD-10-CM | POA: Diagnosis present

## 2021-05-10 DIAGNOSIS — E119 Type 2 diabetes mellitus without complications: Secondary | ICD-10-CM | POA: Diagnosis present

## 2021-05-10 DIAGNOSIS — I10 Essential (primary) hypertension: Secondary | ICD-10-CM | POA: Diagnosis present

## 2021-05-10 DIAGNOSIS — Z8249 Family history of ischemic heart disease and other diseases of the circulatory system: Secondary | ICD-10-CM | POA: Diagnosis not present

## 2021-05-10 DIAGNOSIS — R7401 Elevation of levels of liver transaminase levels: Secondary | ICD-10-CM | POA: Diagnosis present

## 2021-05-10 DIAGNOSIS — R748 Abnormal levels of other serum enzymes: Secondary | ICD-10-CM

## 2021-05-10 HISTORY — DX: Type 2 diabetes mellitus without complications: E11.9

## 2021-05-10 LAB — LIPID PANEL
Cholesterol: 130 mg/dL (ref 0–200)
HDL: 48 mg/dL (ref >40)
LDL Cholesterol: 69 mg/dL (ref 0–99)
Total CHOL/HDL Ratio: 2.7 RATIO
Triglycerides: 66 mg/dL (ref <150)
VLDL: 13 mg/dL (ref 0–40)

## 2021-05-10 LAB — URINALYSIS, ROUTINE W REFLEX MICROSCOPIC
Bilirubin Urine: NEGATIVE
Glucose, UA: NEGATIVE mg/dL
Hgb urine dipstick: NEGATIVE
Ketones, ur: NEGATIVE mg/dL
Leukocytes,Ua: NEGATIVE
Nitrite: NEGATIVE
Protein, ur: NEGATIVE mg/dL
Specific Gravity, Urine: 1.01 (ref 1.005–1.030)
pH: 6 (ref 5.0–8.0)

## 2021-05-10 LAB — COMPREHENSIVE METABOLIC PANEL
ALT: 882 U/L — ABNORMAL HIGH (ref 0–44)
AST: 451 U/L — ABNORMAL HIGH (ref 15–41)
Albumin: 4.3 g/dL (ref 3.5–5.0)
Alkaline Phosphatase: 141 U/L — ABNORMAL HIGH (ref 38–126)
Anion gap: 11 (ref 5–15)
BUN: 18 mg/dL (ref 6–20)
CO2: 26 mmol/L (ref 22–32)
Calcium: 9.1 mg/dL (ref 8.9–10.3)
Chloride: 100 mmol/L (ref 98–111)
Creatinine, Ser: 1.07 mg/dL (ref 0.61–1.24)
GFR, Estimated: >60 mL/min (ref >60)
Glucose, Bld: 175 mg/dL — ABNORMAL HIGH (ref 70–99)
Potassium: 4.4 mmol/L (ref 3.5–5.1)
Sodium: 137 mmol/L (ref 135–145)
Total Bilirubin: 5.4 mg/dL — ABNORMAL HIGH (ref 0.3–1.2)
Total Protein: 7.7 g/dL (ref 6.5–8.1)

## 2021-05-10 LAB — CBC
HCT: 49.2 % (ref 39.0–52.0)
Hemoglobin: 16.7 g/dL (ref 13.0–17.0)
MCH: 29.5 pg (ref 26.0–34.0)
MCHC: 33.9 g/dL (ref 30.0–36.0)
MCV: 86.9 fL (ref 80.0–100.0)
Platelets: 216 10*3/uL (ref 150–400)
RBC: 5.66 MIL/uL (ref 4.22–5.81)
RDW: 12.8 % (ref 11.5–15.5)
WBC: 14.2 10*3/uL — ABNORMAL HIGH (ref 4.0–10.5)
nRBC: 0 % (ref 0.0–0.2)

## 2021-05-10 LAB — BILIRUBIN, FRACTIONATED(TOT/DIR/INDIR)
Bilirubin, Direct: 0.9 mg/dL — ABNORMAL HIGH (ref 0.0–0.2)
Indirect Bilirubin: 4.7 mg/dL — ABNORMAL HIGH (ref 0.3–0.9)
Total Bilirubin: 5.6 mg/dL — ABNORMAL HIGH (ref 0.3–1.2)

## 2021-05-10 LAB — HEPATITIS PANEL, ACUTE
HCV Ab: NONREACTIVE
Hep A IgM: NONREACTIVE
Hep B C IgM: NONREACTIVE
Hepatitis B Surface Ag: NONREACTIVE

## 2021-05-10 LAB — LIPASE, BLOOD: Lipase: 1847 U/L — ABNORMAL HIGH (ref 11–51)

## 2021-05-10 LAB — HIV ANTIBODY (ROUTINE TESTING W REFLEX): HIV Screen 4th Generation wRfx: NONREACTIVE

## 2021-05-10 MED ORDER — LACTATED RINGERS IV SOLN: INTRAVENOUS | Status: DC

## 2021-05-10 MED ORDER — ONDANSETRON HCL 4 MG/2ML IJ SOLN: 4.0000 mg | Freq: Four times a day (QID) | INTRAMUSCULAR | Status: DC | PRN

## 2021-05-10 MED ORDER — DIATRIZOATE MEGLUMINE & SODIUM 66-10 % PO SOLN
30.0000 mL | Freq: Once | ORAL | Status: DC
  Filled 2021-05-10: qty 30

## 2021-05-10 MED ORDER — ONDANSETRON HCL 4 MG PO TABS: 4.0000 mg | ORAL_TABLET | Freq: Four times a day (QID) | ORAL | Status: DC | PRN

## 2021-05-10 MED ORDER — IOHEXOL 300 MG/ML  SOLN
100.0000 mL | Freq: Once | INTRAMUSCULAR | Status: AC | PRN
  Administered 2021-05-10: 100 mL via INTRAVENOUS

## 2021-05-10 MED ORDER — ENOXAPARIN SODIUM 40 MG/0.4ML IJ SOSY
40.0000 mg | PREFILLED_SYRINGE | INTRAMUSCULAR | Status: DC
  Administered 2021-05-10 – 2021-05-11 (×2): 40 mg via SUBCUTANEOUS
  Filled 2021-05-10 (×2): qty 0.4

## 2021-05-10 MED ORDER — ACETAMINOPHEN 650 MG RE SUPP: 650.0000 mg | Freq: Four times a day (QID) | RECTAL | Status: DC | PRN

## 2021-05-10 MED ORDER — ACETAMINOPHEN 325 MG PO TABS: 650.0000 mg | ORAL_TABLET | Freq: Four times a day (QID) | ORAL | Status: DC | PRN

## 2021-05-10 MED ORDER — MORPHINE SULFATE (PF) 2 MG/ML IV SOLN: 2.0000 mg | INTRAVENOUS | Status: DC | PRN

## 2021-05-10 MED ORDER — DIATRIZOATE MEGLUMINE & SODIUM 66-10 % PO SOLN
ORAL | Status: AC
  Filled 2021-05-10: qty 30

## 2021-05-10 MED ORDER — GADOBUTROL 1 MMOL/ML IV SOLN
7.0000 mL | Freq: Once | INTRAVENOUS | Status: AC | PRN
  Administered 2021-05-10: 7 mL via INTRAVENOUS

## 2021-05-10 MED ORDER — SODIUM CHLORIDE 0.9 % IV SOLN
1.0000 g | Freq: Three times a day (TID) | INTRAVENOUS | Status: DC
  Administered 2021-05-10 – 2021-05-11 (×4): 1 g via INTRAVENOUS
  Filled 2021-05-10 (×4): qty 1

## 2021-05-10 MED ORDER — ONDANSETRON HCL 4 MG/2ML IJ SOLN
4.0000 mg | Freq: Once | INTRAMUSCULAR | Status: DC
  Filled 2021-05-10: qty 2

## 2021-05-10 NOTE — ED Notes (Signed)
Pt O2 sat 88%. Placed pt on 2L [place] O2 sat now 93%

## 2021-05-10 NOTE — ED Triage Notes (Signed)
Pt arrives to ED via [facility]. C/o of abominal pain epigastric region that radiates around to the back, Vomited 3x.  Ems gave 1 round of zofran,  Hx right bundle branch, irritable bowel, kidney stones cbg 171 22G R wrist

## 2021-05-10 NOTE — ED Notes (Signed)
Patient transported to CT 

## 2021-05-10 NOTE — ED Provider Notes (Signed)
[facility] EMERGENCY DEPARTMENT Provider Note   CSN: [id number] Arrival date & time: 05/10/21  3662     History Chief Complaint  Patient presents with   Abdominal Pain    Kyle [name] is a 50 y.o. male.  The history is provided by the patient.  Abdominal Pain He has history of hypertension, hyperlipidemia and is status postcholecystectomy and comes in with epigastric pain which started at 8:30 AM.  Pain radiates into the back.  Initially was as severe as 8/10.  He did receive some pain medication, and pain level is down to 4/10.  He did have an episode of vomiting, but that was after he tried to eat something.  Pain did improve slightly following emesis.  Pain is somewhat similar to what he had prior to his cholecystectomy, but he did not have emesis with that episode.  He denies fever, chills, sweats.   Past Medical History:  Diagnosis Date   Hyperlipidemia    Hypertension    Kidney stone    Left bundle branch block     Patient Active Problem List   Diagnosis Date Noted   Calculus of gallbladder with acute cholecystitis without obstruction    Biliary colic    Calculus of bile duct without cholecystitis and without obstruction    Total bilirubin, elevated    Choledocholithiasis 10/19/2016   Heart murmur 10/19/2016   Essential hypertension 02/21/2014   LBBB (left bundle branch block) 02/21/2014   Hyperlipemia 02/21/2014   Right inguinal hernia 02/08/2013    Past Surgical History:  Procedure Laterality Date   CHOLECYSTECTOMY N/A 10/23/2016   Procedure: LAPAROSCOPIC CHOLECYSTECTOMY;  Surgeon: Franky Macho, MD;  Location: [facility];  Service: General;  Laterality: N/A;   HERNIA REPAIR Right 2014       Family History  Problem Relation Age of Onset   Heart attack Father    Cancer Maternal Aunt        breast    Social History   Tobacco Use   Smoking status: Never   Smokeless tobacco: Never  Substance Use Topics   Alcohol use: Yes    Comment: rare; beer  or 2 a month   Drug use: No    Home Medications Prior to Admission medications   Medication Sig Start Date End Date Taking? Authorizing Provider  amLODipine (NORVASC) 10 MG tablet TAKE 1 TABLET BY MOUTH DAILY. PLEASE MAKE APPT 03/19/21   Jake Bathe, MD  atorvastatin (LIPITOR) 20 MG tablet TAKE 1 TABLET BY MOUTH DAILY. PLEASE MAKE APPT 03/19/21   Jake Bathe, MD  ibuprofen (ADVIL) 800 MG tablet Take 1 tablet (800 mg total) by mouth with breakfast, with lunch, and with evening meal. 10/27/20   Caccavale, Sophia, PA-C  metFORMIN (GLUCOPHAGE) 500 MG tablet Take 1 tablet (500 mg total) by mouth 2 (two) times daily with a meal. 06/15/20   Jake Bathe, MD  ondansetron (ZOFRAN) 4 MG tablet Take 1 tablet (4 mg total) by mouth every 8 (eight) hours as needed for nausea or vomiting. 10/27/20   Caccavale, Sophia, PA-C  oxyCODONE-acetaminophen (PERCOCET/ROXICET) 5-325 MG tablet Take 1 tablet by mouth every 6 (six) hours as needed for severe pain. 10/27/20   Caccavale, Sophia, PA-C  tamsulosin (FLOMAX) 0.4 MG CAPS capsule Take 1 capsule (0.4 mg total) by mouth daily. 10/27/20   Caccavale, Sophia, PA-C    Allergies    Nitroglycerin  Review of Systems   Review of Systems  Gastrointestinal:  Positive for abdominal pain.  All other systems reviewed and are negative.  Physical Exam Updated Vital Signs BP 140/81    Pulse (!) 109    Temp 98.5 F (36.9 C) (Oral)    Resp 15    Ht  (1.727 m)    Wt 85.3 kg    SpO2 96%    BMI 28.59 kg/m   Physical Exam Vitals and nursing note reviewed.  50 year old male, resting comfortably and in no acute distress. Vital signs are significant for elevated respiratory rate. Oxygen saturation is 94%, which is normal. Head is normocephalic and atraumatic. PERRLA, EOMI. Oropharynx is clear. Neck is nontender and supple without adenopathy or JVD. Back is nontender and there is no CVA tenderness. Lungs are clear without rales, wheezes, or rhonchi. Chest is  nontender. Heart has regular rate and rhythm without murmur. Abdomen is soft, flat, with mild epigastric tenderness.  There are no masses or hepatosplenomegaly and peristalsis is hypoactive. Extremities have no cyanosis or edema, full range of motion is present. Skin is warm and dry without rash. Neurologic: Mental status is normal, cranial nerves are intact, there are no motor or sensory deficits.  ED Results / Procedures / Treatments   Labs (all labs ordered are listed, but only abnormal results are displayed) Labs Reviewed  LIPASE, BLOOD - Abnormal; Notable for the following components:      Result Value   Lipase 1,847 (*)    All other components within normal limits  COMPREHENSIVE METABOLIC PANEL - Abnormal; Notable for the following components:   Glucose, Bld 175 (*)    AST 451 (*)    ALT 882 (*)    Alkaline Phosphatase 141 (*)    Total Bilirubin 5.4 (*)    All other components within normal limits  CBC - Abnormal; Notable for the following components:   WBC 14.2 (*)    All other components within normal limits  URINALYSIS, ROUTINE W REFLEX MICROSCOPIC  HEPATITIS PANEL, ACUTE    EKG EKG Interpretation  Date/Time:  Thursday May 10 2021 01:56:55 EST Ventricular Rate:  100 PR Interval:  194 QRS Duration: 136 QT Interval:  372 QTC Calculation: 480 R Axis:   247 Text Interpretation: Right and left arm electrode reversal, interpretation assumes no reversal Sinus tachycardia Left bundle branch block Probable left atrial enlargement When compared with ECG of 09/01/2009, Arm lead reversal is confirmed. Confirmed by Dione Booze ([id number]) on 05/10/2021 3:31:37 AM  Radiology CT ABDOMEN PELVIS W CONTRAST  Result Date: 05/10/2021 CLINICAL DATA:  50 year old male with abdominal pain in the epigastric region radiating around to the back. Emesis. Suspected acute pancreatitis. EXAM: CT ABDOMEN AND PELVIS WITH CONTRAST TECHNIQUE: Multidetector CT imaging of the abdomen and pelvis was  performed using the standard protocol following bolus administration of intravenous contrast. CONTRAST:  OMNIPAQUE IOHEXOL 300 MG/ML  SOLN COMPARISON:  CT the abdomen and pelvis 10/27/2020. FINDINGS: Lower chest: Scattered areas of atelectasis and/or scarring are noted in the lung bases bilaterally. Hepatobiliary: No suspicious cystic or solid hepatic lesions. No intra or extrahepatic biliary ductal dilatation. Status post cholecystectomy. Pancreas: No pancreatic mass. No pancreatic ductal dilatation. Subtle inflammatory changes are noted in the fat adjacent to the head and uncinate process of the pancreas, suggesting early or mild acute pancreatitis. Pancreatic parenchyma enhances normally. No pancreatic or peripancreatic fluid collections to indicate pseudocyst at this time. Spleen: Unremarkable. Adrenals/Urinary Tract: Nonobstructive calculi are noted within both renal collecting systems measuring up to 6 mm in the lower pole collecting  system of the left kidney. Bilateral kidneys and adrenal glands are otherwise normal in appearance. No hydroureteronephrosis. Urinary bladder is normal in appearance. Stomach/Bowel: The appearance of the stomach is normal. Inflammatory changes are noted adjacent to the duodenum, presumably secondary to adjacent pancreatitis. No pathologic dilatation of small bowel or colon. Normal appendix. Vascular/Lymphatic: No atherosclerotic disease, aneurysm or dissection noted in the abdominal or pelvic vasculature. No lymphadenopathy noted in the abdomen or pelvis. Reproductive: Prostate gland and seminal vesicles are unremarkable in appearance. Other: Small right inguinal hernia containing only fat. No significant volume of ascites. No pneumoperitoneum. Musculoskeletal: There are no aggressive appearing lytic or blastic lesions noted in the visualized portions of the skeleton. IMPRESSION: 1. Subtle inflammatory changes adjacent to the head and uncinate process of the pancreas,  suggesting an acute pancreatitis. This appears uncomplicated at this time. 2. Nonobstructive calculi within the collecting systems of both kidneys measuring up to 6 mm in the lower pole collecting system of left kidney. No ureteral stones or findings of urinary tract obstruction are noted at this time. 3. Small right inguinal hernia containing only fat. No associated bowel incarceration or obstruction at this time. Electronically Signed   By: Trudie Reed M.D.   On: 05/10/2021 05:50    Procedures Procedures   Medications Ordered in ED Medications  diatrizoate meglumine-sodium (GASTROGRAFIN) 66-10 % solution 30 mL (has no administration in time range)  diatrizoate meglumine-sodium (GASTROGRAFIN) 66-10 % solution (has no administration in time range)  ondansetron (ZOFRAN) injection 4 mg (4 mg Intravenous Not Given 05/10/21 0430)  iohexol (OMNIPAQUE) 300 MG/ML solution 100 mL (100 mLs Intravenous Contrast Given 05/10/21 0522)    ED Course  I have reviewed the triage vital signs and the nursing notes.  Pertinent labs & imaging results that were available during my care of the patient were reviewed by me and considered in my medical decision making (see chart for details).    MDM Rules/Calculators/A&P                         Epigastric pain in patient who is status post cholecystectomy.  Consider peptic ulcer disease, pancreatitis, common bile duct stone, diverticulitis.  These conditions include somewhat significant potential morbidity and mortality.  Labs show significant elevation of transaminases and mild elevation of alkaline phosphatase, moderate elevation of bilirubin.  Last liver tests on record were from January and these were significant change.  Lipase is still pending.  With those labs, I am certainly concerned about either bile duct stricture or common bile duct calculus.  Ultrasound is not immediately available, he will be sent for CT of abdomen and pelvis.  Old records are reviewed  confirming cholecystectomy in 2018, essentially normal liver functions 11 months ago.  CT scan shows evidence of acute pancreatitis.  This is correlated with lipase which is markedly elevated at 1847.  No dilated hepatic ducts were seen.  Because of pancreatitis with evidence of jaundice and transaminitis, decision was made to admit.  Case is discussed with Dr. Thomes Dinning of [facility] who agrees to admit the patient.  Case also discussed with Dr. Levon Hedger, on-call for gastroenterology who agrees to see the patient in consultation, requests MRCP to be ordered.  Final Clinical Impression(s) / ED Diagnoses Final diagnoses:  Acute pancreatitis without infection or necrosis, unspecified pancreatitis type  Jaundice  Elevated liver function tests    Rx / DC Orders ED Discharge Orders     None  Dione Booze, MD 05/10/21 [phone]

## 2021-05-10 NOTE — H&P (Addendum)
History and Physical  Kyle [name] [id number] DOB: Jan 26, 1971 DOA: 05/10/2021   PCP: Sandi Mariscal, MD   Patient coming from: Home  Chief Complaint: abdominal pain  HPI:  Kyle [name] is a 50 y.o. male with medical history of DM2, hypertension, hyperlipidemia, left bundle branch block presenting with 1 day history of abdominal pain that began in the morning of 05/09/2021 after he ate some "cheez-itz".  He stated that he could not eat lunch.  He ate some dinner after which she had an episode of emesis without blood.  His pain initially improved, but came back.  As result, he presented to the emergency department for further evaluation.  Prior to his abdominal pain, the patient had been in his usual state of health.  He has not had any fevers, chills, headache, neck pain, chest pain, shortness of breath, coughing, hemoptysis, nausea, vomiting, diarrhea, hematochezia, melena.  There is no dysuria or hematuria.  The patient denies any new medications.  He drinks beer socially, but has not had a drink for a month.  He does not smoke.  He does not use any illicit drugs.  He had a cholecystectomy in 2018 performed by Dr. Aviva Signs.  He went to see [facility] on 05/09/21 about his abd pain, and was prescribed pantoprazole and blood work was performed.  He stated that he had not been called about his blood work results. In the ED, the patient was afebrile hemodynamically stable with oxygen saturation 94-95% room air.  BMP shows sodium 137, potassium 4.4, bicarbonate 26, serum creatinine 1.07.  AST 451, ALT 882, alkaline phosphatase 141, total bilirubin 5.4, lipase 18 is 47.  WBC 14.2, hemoglobin 16.7, platelets 216,000.  UA was negative for any pyuria.  CT abdomen showed subtle inflammation in the head and uncinate process of the pancreas.  There was no biliary ductal dilatation.  There was nonobstructive renal calculi.  The patient was started on IV fluids.  GI was consulted to assist with  management.  Assessment/Plan: Acute pancreatitis -remain npo -IVF -MRCP with elevated LFTs and hyperbilirubinemia -Judicious opioids -GI consult -Check lipid panel -05/10/2021 CT abdomen as discussed above  Transaminasemia -concerned about a degree of cholangitis -start empiric antibiotics  Hypertension -Continue amlodipine  Hyperlipidemia -Continue statin  Hyperbilirubinemia -fractionate  Diabetes mellitus type 2 -Holding metformin -Check hemoglobin A1c -05/30/2020 hemoglobin A1c 7.2 -NovoLog sliding scale  Left bundle branch block -Review of previous and current EKGs shows unchanged LBBB       Past Medical History:  Diagnosis Date   Hyperlipidemia    Hypertension    Kidney stone    Left bundle branch block    Past Surgical History:  Procedure Laterality Date   CHOLECYSTECTOMY N/A 10/23/2016   Procedure: LAPAROSCOPIC CHOLECYSTECTOMY;  Surgeon: Aviva Signs, MD;  Location: [facility];  Service: General;  Laterality: N/A;   HERNIA REPAIR Right 2014   Social History:  reports that he has never smoked. He has never used smokeless tobacco. He reports current alcohol use. He reports that he does not use drugs.   Family History  Problem Relation Age of Onset   Heart attack Father    Cancer Maternal Aunt        breast     Allergies  Allergen Reactions   Nitroglycerin Other (See Comments)    syncope      Prior to Admission medications   Medication Sig Start Date End Date Taking? Authorizing Provider  amLODipine (NORVASC) 10  MG tablet TAKE 1 TABLET BY MOUTH DAILY. PLEASE MAKE APPT 03/19/21   Jake Bathe, MD  atorvastatin (LIPITOR) 20 MG tablet TAKE 1 TABLET BY MOUTH DAILY. PLEASE MAKE APPT 03/19/21   Jake Bathe, MD  ibuprofen (ADVIL) 800 MG tablet Take 1 tablet (800 mg total) by mouth with breakfast, with lunch, and with evening meal. 10/27/20   Caccavale, Sophia, PA-C  metFORMIN (GLUCOPHAGE) 500 MG tablet Take 1 tablet (500 mg total) by mouth 2 (two)  times daily with a meal. 06/15/20   Jake Bathe, MD  ondansetron (ZOFRAN) 4 MG tablet Take 1 tablet (4 mg total) by mouth every 8 (eight) hours as needed for nausea or vomiting. 10/27/20   Caccavale, Sophia, PA-C  oxyCODONE-acetaminophen (PERCOCET/ROXICET) 5-325 MG tablet Take 1 tablet by mouth every 6 (six) hours as needed for severe pain. 10/27/20   Caccavale, Sophia, PA-C  tamsulosin (FLOMAX) 0.4 MG CAPS capsule Take 1 capsule (0.4 mg total) by mouth daily. 10/27/20   Caccavale, Sophia, PA-C    Review of Systems:  Constitutional:  No weight loss, night sweats, Fevers, chills, fatigue.  Head&Eyes: No headache.  No vision loss.  No eye pain or scotoma ENT:  No Difficulty swallowing,Tooth/dental problems,Sore throat,  No ear ache, post nasal drip,  Cardio-vascular:  No chest pain, Orthopnea, PND, swelling in lower extremities,  dizziness, palpitations  GI:  No  diarrhea, loss of appetite, hematochezia, melena, heartburn, indigestion, Resp:  No shortness of breath with exertion or at rest. No cough. No coughing up of blood .No wheezing.No chest wall deformity  Skin:  no rash or lesions.  GU:  no dysuria, change in color of urine, no urgency or frequency. No flank pain.  Musculoskeletal:  No joint pain or swelling. No decreased range of motion. No back pain.  Psych:  No change in mood or affect. No depression or anxiety. Neurologic: No headache, no dysesthesia, no focal weakness, no vision loss. No syncope  Physical Exam: Vitals:   05/10/21 0339 05/10/21 0340 05/10/21 0345 05/10/21 0400  BP:    139/88  Pulse: 97 (!) 109 96 95  Resp: (!) 23 15 (!) 24 (!) 23  Temp:      TempSrc:      SpO2: (!) 88% 96% 94% 97%  Weight:      Height:       General:  A&O x 3, NAD, nontoxic, pleasant/cooperative Head/Eye: No conjunctival hemorrhage, no icterus, [place]/AT, No nystagmus ENT:  No icterus,  No thrush, good dentition, no pharyngeal exudate Neck:  No masses, no lymphadenpathy, no bruits CV:   RRR, no rub, no gallop, no S3 Lung:  CTAB, good air movement, no wheeze, no rhonchi Abdomen: soft/+epigastric pain +BS, nondistended, no peritoneal signs Ext: No cyanosis, No rashes, No petechiae, No lymphangitis, No edema Neuro: CNII-XII intact, strength 4/5 in bilateral upper and lower extremities, no dysmetria  Labs on Admission:  Basic Metabolic Panel: Recent Labs  Lab 05/10/21 0229  NA 137  K 4.4  CL 100  CO2 26  GLUCOSE 175*  BUN 18  CREATININE 1.07  CALCIUM 9.1   Liver Function Tests: Recent Labs  Lab 05/10/21 0229  AST 451*  ALT 882*  ALKPHOS 141*  BILITOT 5.4*  PROT 7.7  ALBUMIN 4.3   Recent Labs  Lab 05/10/21 0229  LIPASE 1,847*   No results for input(s): AMMONIA in the last 168 hours. CBC: Recent Labs  Lab 05/10/21 0229  WBC 14.2*  HGB 16.7  HCT  49.2  MCV 86.9  PLT 216   Coagulation Profile: No results for input(s): INR, PROTIME in the last 168 hours. Cardiac Enzymes: No results for input(s): CKTOTAL, CKMB, CKMBINDEX, TROPONINI in the last 168 hours. BNP: Invalid input(s): POCBNP CBG: No results for input(s): GLUCAP in the last 168 hours. Urine analysis:    Component Value Date/Time   COLORURINE YELLOW 05/10/2021 0204   APPEARANCEUR CLEAR 05/10/2021 0204   LABSPEC 1.010 05/10/2021 0204   PHURINE 6.0 05/10/2021 0204   GLUCOSEU NEGATIVE 05/10/2021 0204   HGBUR NEGATIVE 05/10/2021 0204   BILIRUBINUR NEGATIVE 05/10/2021 0204   KETONESUR NEGATIVE 05/10/2021 0204   PROTEINUR NEGATIVE 05/10/2021 0204   NITRITE NEGATIVE 05/10/2021 0204   LEUKOCYTESUR NEGATIVE 05/10/2021 0204   Sepsis Labs: @LABRCNTIP (procalcitonin:4,lacticidven:4) )No results found for this or any previous visit (from the past 240 hour(s)).   Radiological Exams on Admission: CT ABDOMEN PELVIS W CONTRAST  Result Date: 05/10/2021 CLINICAL DATA:  50 year old male with abdominal pain in the epigastric region radiating around to the back. Emesis. Suspected acute  pancreatitis. EXAM: CT ABDOMEN AND PELVIS WITH CONTRAST TECHNIQUE: Multidetector CT imaging of the abdomen and pelvis was performed using the standard protocol following bolus administration of intravenous contrast. CONTRAST:  168mL OMNIPAQUE IOHEXOL 300 MG/ML  SOLN COMPARISON:  CT the abdomen and pelvis 10/27/2020. FINDINGS: Lower chest: Scattered areas of atelectasis and/or scarring are noted in the lung bases bilaterally. Hepatobiliary: No suspicious cystic or solid hepatic lesions. No intra or extrahepatic biliary ductal dilatation. Status post cholecystectomy. Pancreas: No pancreatic mass. No pancreatic ductal dilatation. Subtle inflammatory changes are noted in the fat adjacent to the head and uncinate process of the pancreas, suggesting early or mild acute pancreatitis. Pancreatic parenchyma enhances normally. No pancreatic or peripancreatic fluid collections to indicate pseudocyst at this time. Spleen: Unremarkable. Adrenals/Urinary Tract: Nonobstructive calculi are noted within both renal collecting systems measuring up to 6 mm in the lower pole collecting system of the left kidney. Bilateral kidneys and adrenal glands are otherwise normal in appearance. No hydroureteronephrosis. Urinary bladder is normal in appearance. Stomach/Bowel: The appearance of the stomach is normal. Inflammatory changes are noted adjacent to the duodenum, presumably secondary to adjacent pancreatitis. No pathologic dilatation of small bowel or colon. Normal appendix. Vascular/Lymphatic: No atherosclerotic disease, aneurysm or dissection noted in the abdominal or pelvic vasculature. No lymphadenopathy noted in the abdomen or pelvis. Reproductive: Prostate gland and seminal vesicles are unremarkable in appearance. Other: Small right inguinal hernia containing only fat. No significant volume of ascites. No pneumoperitoneum. Musculoskeletal: There are no aggressive appearing lytic or blastic lesions noted in the visualized portions of  the skeleton. IMPRESSION: 1. Subtle inflammatory changes adjacent to the head and uncinate process of the pancreas, suggesting an acute pancreatitis. This appears uncomplicated at this time. 2. Nonobstructive calculi within the collecting systems of both kidneys measuring up to 6 mm in the lower pole collecting system of left kidney. No ureteral stones or findings of urinary tract obstruction are noted at this time. 3. Small right inguinal hernia containing only fat. No associated bowel incarceration or obstruction at this time. Electronically Signed   By: Vinnie Langton M.D.   On: 05/10/2021 05:50    EKG: Independently reviewed. Sinus LBBB    Time spent:60 minutes Code Status:   FULL Family Communication: spouse at bedside 12/15 Disposition Plan: expect 2 day hospitalization Consults called: GI  DVT Prophylaxis: [place] Lovenox  Orson Eva, DO  [facility] Pager [phone]  If 7PM-7AM, please contact night-coverage  [email or web address] Password [facility] 05/10/2021, 7:37 AM

## 2021-05-10 NOTE — Consult Note (Signed)
Referring Provider: Frankey Shown, DO Primary Care Physician:  Salli Real, MD Primary Gastroenterologist: [facility]  Reason for Consultation: Pancreatitis, hyperbilirubinemia  HPI: Kyle [name] is a 50 y.o. male with diabetes mellitus, hypertension, hyperlipidemia, left bundle branch block presenting with acute onset abdominal pain, N/V.   Patient states his abdominal pain began yesterday after eating a handful of [employer]ez-[employer] while at work. Pain in epigastric/right upper quadrant.  His pain progressed, radiating into the back. He was seen by his GI, Dr. Dulce Sellar, yesterday afternoon around 3pm. Started on pantoprazole and blood work done (he is unaware of results at this time). He stopped at [facility] on the way home and ate french fries and a chicken sandwich. Later last night he ate dinner and immediately began having nonbloody emesis.  Pain somewhat relieved but then returned thereafter.  Prior to this episode, he recalls having a similar episode about a month ago but very mild. Has some labs with PCP and noted liver number up, per patient one of the numbers was 171, others normal. No new medications.  He drinks beer socially but none in the past month.  Denies illicit drug use.  He had a cholecystectomy (cholelithiasis) in 2018.  At that time there was concern for possible gallbladder sludge versus tiny choledocholithiasis within the common bile duct which measured 7.5 mm.  MRCP completed showing acute cholecystitis but no evidence of biliary ductal dilation or choledocholithiasis.     In the ED vital signs stable.  Sodium 137, potassium 4.4, creatinine 1.07, AST 451, ALT 882, alkaline phosphatase 141, total bilirubin 5.4, lipase 1847, white blood cell count 14,200, hemoglobin 16.7, platelets 216,000.  Of note his LFTs in January showed minimal elevation of ALT at 63 and total bilirubin at 1.4.  Has some chronic hyperbilirubinemia, previously differentiated and suspected Gilbert's in 2019.  CT  abdomen/pelvis with contrast showed subtle inflammation in the head and uncinate process of the pancreas.  Inflammatory changes noted adjacent to the duodenum presumably secondary to adjacent pancreatitis.  No biliary ductal dilation.  MRCP Common bile duct 4 mm, no definite filling defects within the common bile duct, no pancreatic ductal dilation, findings suggestive of acute pancreatitis at the head and uncinate process.  Subtle lesion adjacent to the gallbladder fossa with unusual imaging characteristics.  Favored to represent a focal area of iron deposition within the hepatic parenchyma likely related to prior cholecystectomy, with associated perfusion deficit.  Abdominal MRI with and without IV gadolinium recommended in 6 months.  Limited right upper quadrant ultrasound completed this morning with CBD of 4 mm, hepatic steatosis.   Prior to Admission medications   Medication Sig Start Date End Date Taking? Authorizing Provider  amLODipine (NORVASC) 10 MG tablet TAKE 1 TABLET BY MOUTH DAILY. PLEASE MAKE APPT 03/19/21   Jake Bathe, MD  atorvastatin (LIPITOR) 20 MG tablet TAKE 1 TABLET BY MOUTH DAILY. PLEASE MAKE APPT 03/19/21   Jake Bathe, MD  ibuprofen (ADVIL) 800 MG tablet Take 1 tablet (800 mg total) by mouth with breakfast, with lunch, and with evening meal. 10/27/20   Caccavale, Sophia, PA-C  metFORMIN (GLUCOPHAGE) 500 MG tablet Take 1 tablet (500 mg total) by mouth 2 (two) times daily with a meal. 06/15/20   Jake Bathe, MD  ondansetron (ZOFRAN) 4 MG tablet Take 1 tablet (4 mg total) by mouth every 8 (eight) hours as needed for nausea or vomiting. 10/27/20   Caccavale, Sophia, PA-C  oxyCODONE-acetaminophen (PERCOCET/ROXICET) 5-325 MG tablet Take 1 tablet  by mouth every 6 (six) hours as needed for severe pain. 10/27/20   Caccavale, Sophia, PA-C  tamsulosin (FLOMAX) 0.4 MG CAPS capsule Take 1 capsule (0.4 mg total) by mouth daily. 10/27/20   Caccavale, Sophia, PA-C    Current  Facility-Administered Medications  Medication Dose Route Frequency Provider Last Rate Last Admin   acetaminophen (TYLENOL) tablet 650 mg  650 mg Oral Q6H PRN Tat, Shanon Brow, MD       Or   acetaminophen (TYLENOL) suppository 650 mg  650 mg Rectal Q6H PRN Tat, Shanon Brow, MD       diatrizoate meglumine-sodium (GASTROGRAFIN) 66-10 % solution 30 mL  30 mL Oral Once Delora Fuel, MD       diatrizoate meglumine-sodium (GASTROGRAFIN) 66-10 % solution            enoxaparin (LOVENOX) injection 40 mg  40 mg Subcutaneous Q24H Tat, David, MD       lactated ringers infusion   Intravenous Continuous Tat, Shanon Brow, MD       meropenem (MERREM) 1 g in sodium chloride 0.9 % 100 mL IVPB  1 g Intravenous Franco Collet, MD   Stopped at 05/10/21 [phone]   morphine 2 MG/ML injection 2 mg  2 mg Intravenous Q4H PRN Tat, Shanon Brow, MD       ondansetron [facility]) injection 4 mg  4 mg Intravenous Once Delora Fuel, MD       ondansetron [facility]) tablet 4 mg  4 mg Oral Q6H PRN Tat, Shanon Brow, MD       Or   ondansetron (ZOFRAN) injection 4 mg  4 mg Intravenous Q6H PRN Tat, David, MD       Current Outpatient Medications  Medication Sig Dispense Refill   amLODipine (NORVASC) 10 MG tablet TAKE 1 TABLET BY MOUTH DAILY. PLEASE MAKE APPT (Patient taking differently: Take 10 mg by mouth daily.) 90 tablet 2   atorvastatin (LIPITOR) 20 MG tablet TAKE 1 TABLET BY MOUTH DAILY. PLEASE MAKE APPT (Patient taking differently: Take 20 mg by mouth daily.) 90 tablet 2   ibuprofen (ADVIL) 800 MG tablet Take 1 tablet (800 mg total) by mouth with breakfast, with lunch, and with evening meal. 21 tablet 0   metFORMIN (GLUCOPHAGE) 1000 MG tablet Take 1,000 mg by mouth 2 (two) times daily.     ondansetron (ZOFRAN) 4 MG tablet Take 1 tablet (4 mg total) by mouth every 8 (eight) hours as needed for nausea or vomiting. 8 tablet 0   metFORMIN (GLUCOPHAGE) 500 MG tablet Take 1 tablet (500 mg total) by mouth 2 (two) times daily with a meal. (Patient not taking: Reported on  05/10/2021) 60 tablet 3   oxyCODONE-acetaminophen (PERCOCET/ROXICET) 5-325 MG tablet Take 1 tablet by mouth every 6 (six) hours as needed for severe pain. (Patient not taking: Reported on 05/10/2021) 8 tablet 0   tamsulosin (FLOMAX) 0.4 MG CAPS capsule Take 1 capsule (0.4 mg total) by mouth daily. (Patient not taking: Reported on 05/10/2021) 30 capsule 0    Allergies as of 05/10/2021 - Review Complete 05/10/2021  Allergen Reaction Noted   Nitroglycerin Other (See Comments) 02/08/2013    Past Medical History:  Diagnosis Date   Diabetes ([place])    Hyperlipidemia    Hypertension    Kidney stone    Left bundle branch block     Past Surgical History:  Procedure Laterality Date   CHOLECYSTECTOMY N/A 10/23/2016   Procedure: LAPAROSCOPIC CHOLECYSTECTOMY;  Surgeon: Aviva Signs, MD;  Location: [facility];  Service: General;  Laterality:  N/A;   HERNIA REPAIR Right 2014    Family History  Problem Relation Age of Onset   Heart attack Father    Cancer Maternal Aunt        breast    Social History   Socioeconomic History   Marital status: Married    Spouse name: Not on file   Number of children: Not on file   Years of education: Not on file   Highest education level: Not on file  Occupational History   Not on file  Tobacco Use   Smoking status: Never   Smokeless tobacco: Never  Substance and Sexual Activity   Alcohol use: Yes    Comment: rare; beer or 2 a month   Drug use: No   Sexual activity: Not on file  Other Topics Concern   Not on file  Social History Narrative   Not on file   Social Determinants of Health   Financial Resource Strain: Not on file  Food Insecurity: Not on file  Transportation Needs: Not on file  Physical Activity: Not on file  Stress: Not on file  Social Connections: Not on file  Intimate Partner Violence: Not on file     ROS:  General: Negative for anorexia, weight loss, fever, chills, fatigue, weakness. Eyes: Negative for vision changes.   ENT: Negative for hoarseness, difficulty swallowing , nasal congestion. CV: Negative for chest pain, angina, palpitations, dyspnea on exertion, peripheral edema.  Respiratory: Negative for dyspnea at rest, dyspnea on exertion, cough, sputum, wheezing.  GI: See history of present illness. GU:  Negative for dysuria, hematuria, urinary incontinence, urinary frequency, nocturnal urination.  MS: Negative for joint pain, low back pain.  Derm: Negative for rash or itching.  Neuro: Negative for weakness, abnormal sensation, seizure, frequent headaches, memory loss, confusion.  Psych: Negative for anxiety, depression, suicidal ideation, hallucinations.  Endo: Negative for unusual weight change.  Heme: Negative for bruising or bleeding. Allergy: Negative for rash or hives.       Physical Examination: Vital signs in last 24 hours: Temp:  [98.5 F (36.9 C)] 98.5 F (36.9 C) (12/15 0157) Pulse Rate:  [84-109] 84 (12/15 0930) Resp:  [15-26] 17 (12/15 0930) BP: (120-160)/(66-88) 120/66 (12/15 0930) SpO2:  [88 %-97 %] 96 % (12/15 0930) Weight:  [85.3 kg] 85.3 kg (12/15 0201)    General: Well-nourished, well-developed in no acute distress.  Head: Normocephalic, atraumatic.   Eyes: Conjunctiva pink, mild icterus. Mouth: masked Neck: Supple without thyromegaly, masses, or lymphadenopathy.  Lungs: Clear to auscultation bilaterally.  Heart: Regular rate and rhythm, no murmurs rubs or gallops.  Abdomen: Bowel sounds are normal, nontender, nondistended, no hepatosplenomegaly or masses, no abdominal bruits or    hernia , no rebound or guarding.   Rectal: not performed Extremities: No lower extremity edema, clubbing, deformity.  Neuro: Alert and oriented x 4 , grossly normal neurologically.  Skin: Warm and dry, no rash or jaundice.   Psych: Alert and cooperative, normal mood and affect.        Intake/Output from previous day: 12/14 0701 - 12/15 0700 In: -  Out: 500 [Urine:500] Intake/Output this  shift: Total I/O In: 104.3 [IV Piggyback:104.3] Out: -   Lab Results: CBC Recent Labs    05/10/21 0229  WBC 14.2*  HGB 16.7  HCT 49.2  MCV 86.9  PLT 216   BMET Recent Labs    05/10/21 0229  NA 137  K 4.4  CL 100  CO2 26  GLUCOSE 175*  BUN 18  CREATININE 1.07  CALCIUM 9.1   LFT Recent Labs    05/10/21 0229 05/10/21 1000  BILITOT 5.4* 5.6*  BILIDIR  --  0.9*  IBILI  --  4.7*  ALKPHOS 141*  --   AST 451*  --   ALT 882*  --   PROT 7.7  --   ALBUMIN 4.3  --     Lipase Recent Labs    05/10/21 0229  LIPASE 1,847*    PT/INR No results for input(s): LABPROT, INR in the last 72 hours.    Imaging Studies: CT ABDOMEN PELVIS W CONTRAST  Result Date: 05/10/2021 CLINICAL DATA:  50 year old male with abdominal pain in the epigastric region radiating around to the back. Emesis. Suspected acute pancreatitis. EXAM: CT ABDOMEN AND PELVIS WITH CONTRAST TECHNIQUE: Multidetector CT imaging of the abdomen and pelvis was performed using the standard protocol following bolus administration of intravenous contrast. CONTRAST:  157mL OMNIPAQUE IOHEXOL 300 MG/ML  SOLN COMPARISON:  CT the abdomen and pelvis 10/27/2020. FINDINGS: Lower chest: Scattered areas of atelectasis and/or scarring are noted in the lung bases bilaterally. Hepatobiliary: No suspicious cystic or solid hepatic lesions. No intra or extrahepatic biliary ductal dilatation. Status post cholecystectomy. Pancreas: No pancreatic mass. No pancreatic ductal dilatation. Subtle inflammatory changes are noted in the fat adjacent to the head and uncinate process of the pancreas, suggesting early or mild acute pancreatitis. Pancreatic parenchyma enhances normally. No pancreatic or peripancreatic fluid collections to indicate pseudocyst at this time. Spleen: Unremarkable. Adrenals/Urinary Tract: Nonobstructive calculi are noted within both renal collecting systems measuring up to 6 mm in the lower pole collecting system of the left  kidney. Bilateral kidneys and adrenal glands are otherwise normal in appearance. No hydroureteronephrosis. Urinary bladder is normal in appearance. Stomach/Bowel: The appearance of the stomach is normal. Inflammatory changes are noted adjacent to the duodenum, presumably secondary to adjacent pancreatitis. No pathologic dilatation of small bowel or colon. Normal appendix. Vascular/Lymphatic: No atherosclerotic disease, aneurysm or dissection noted in the abdominal or pelvic vasculature. No lymphadenopathy noted in the abdomen or pelvis. Reproductive: Prostate gland and seminal vesicles are unremarkable in appearance. Other: Small right inguinal hernia containing only fat. No significant volume of ascites. No pneumoperitoneum. Musculoskeletal: There are no aggressive appearing lytic or blastic lesions noted in the visualized portions of the skeleton. IMPRESSION: 1. Subtle inflammatory changes adjacent to the head and uncinate process of the pancreas, suggesting an acute pancreatitis. This appears uncomplicated at this time. 2. Nonobstructive calculi within the collecting systems of both kidneys measuring up to 6 mm in the lower pole collecting system of left kidney. No ureteral stones or findings of urinary tract obstruction are noted at this time. 3. Small right inguinal hernia containing only fat. No associated bowel incarceration or obstruction at this time. Electronically Signed   By: Vinnie Langton M.D.   On: 05/10/2021 05:50   MR 3D Recon At Scanner  Result Date: 05/10/2021 CLINICAL DATA:  50 year old male with history of jaundice. EXAM: MRI ABDOMEN WITHOUT AND WITH CONTRAST (INCLUDING MRCP) TECHNIQUE: Multiplanar multisequence MR imaging of the abdomen was performed both before and after the administration of intravenous contrast. Heavily T2-weighted images of the biliary and pancreatic ducts were obtained, and three-dimensional MRCP images were rendered by post processing. CONTRAST:  76mL GADAVIST  GADOBUTROL 1 MMOL/ML IV SOLN COMPARISON:  CT of the abdomen and pelvis 05/10/2021. Abdominal MRI 10/22/2016. FINDINGS: Lower chest: Unremarkable. Hepatobiliary: Mild diffuse loss of signal intensity throughout the hepatic parenchyma on  out of phase dual echo images, indicative of hepatic steatosis. Adjacent to the gallbladder fossaa (axial image 42 of series 9) there is a T1 isointense, T2 hypointense lesion which is hypovascular on post gadolinium imaging measuring approximally 1.9 x 1.2 cm (axial image 44 of series 16). Patient is status post cholecystectomy. No intra or extrahepatic biliary ductal dilatation noted on MRCP images. Common bile duct measures 4 mm in the porta hepatis. No definite filling defects within the common bile duct to suggest the presence of choledocholithiasis. Pancreas: No pancreatic mass. No pancreatic ductal dilatation noted on MRCP images. Mild T2 signal intensity in the peripancreatic fat adjacent to the head and uncinate process of the pancreas, suggestive of acute pancreatitis. Pancreatic parenchyma enhances normally. No pancreatic or peripancreatic fluid collections to suggest pseudocyst. Spleen:  Unremarkable. Adrenals/Urinary Tract: Bilateral kidneys and adrenal glands are normal in appearance. No hydroureteronephrosis in the visualized portions of the abdomen. Stomach/Bowel: Visualized portions are unremarkable. Vascular/Lymphatic: No aneurysm identified in the visualized abdominal vasculature. No lymphadenopathy noted in the visualized abdomen. Other: No significant volume of ascites noted in the visualized portions of the peritoneal cavity. Musculoskeletal: No aggressive appearing osseous lesions are noted in the visualized portions of the skeleton. IMPRESSION: 1. No choledocholithiasis or signs of biliary tract obstruction. 2. There is a subtle lesion adjacent to the gallbladder fossa which has unusual imaging characteristics. This is favored to represent a focal area of iron  deposition within the hepatic parenchyma, likely related to prior cholecystectomy, with an associated perfusion deficit. Follow-up abdominal MRI with and without IV gadolinium is recommended in 6 months to ensure the stability of this finding. 3. Hepatic steatosis. Electronically Signed   By: Trudie Reed M.D.   On: 05/10/2021 08:36   US Abdomen Limited  Result Date: 05/10/2021 CLINICAL DATA:  Elevated liver function tests. EXAM: ULTRASOUND ABDOMEN LIMITED RIGHT UPPER QUADRANT COMPARISON:  Abdominal MRI and CT 05/10/2021 FINDINGS: Gallbladder: Surgically absent. Common bile duct: Diameter: 4 mm Liver: Diffusely increased parenchymal echogenicity without a focal lesion identified. Portal vein is patent on color Doppler imaging with normal direction of blood flow towards the liver. Other: None. IMPRESSION: 1. Hepatic steatosis. 2. Status post cholecystectomy. No biliary dilatation. Electronically Signed   By: Sebastian Ache M.D.   On: 05/10/2021 09:19   MR ABDOMEN MRCP W WO CONTAST  Result Date: 05/10/2021 CLINICAL DATA:  50 year old male with history of jaundice. EXAM: MRI ABDOMEN WITHOUT AND WITH CONTRAST (INCLUDING MRCP) TECHNIQUE: Multiplanar multisequence MR imaging of the abdomen was performed both before and after the administration of intravenous contrast. Heavily T2-weighted images of the biliary and pancreatic ducts were obtained, and three-dimensional MRCP images were rendered by post processing. CONTRAST:  60mL GADAVIST GADOBUTROL 1 MMOL/ML IV SOLN COMPARISON:  CT of the abdomen and pelvis 05/10/2021. Abdominal MRI 10/22/2016. FINDINGS: Lower chest: Unremarkable. Hepatobiliary: Mild diffuse loss of signal intensity throughout the hepatic parenchyma on out of phase dual echo images, indicative of hepatic steatosis. Adjacent to the gallbladder fossaa (axial image 42 of series 9) there is a T1 isointense, T2 hypointense lesion which is hypovascular on post gadolinium imaging measuring approximally  1.9 x 1.2 cm (axial image 44 of series 16). Patient is status post cholecystectomy. No intra or extrahepatic biliary ductal dilatation noted on MRCP images. Common bile duct measures 4 mm in the porta hepatis. No definite filling defects within the common bile duct to suggest the presence of choledocholithiasis. Pancreas: No pancreatic mass. No pancreatic ductal dilatation noted on MRCP  images. Mild T2 signal intensity in the peripancreatic fat adjacent to the head and uncinate process of the pancreas, suggestive of acute pancreatitis. Pancreatic parenchyma enhances normally. No pancreatic or peripancreatic fluid collections to suggest pseudocyst. Spleen:  Unremarkable. Adrenals/Urinary Tract: Bilateral kidneys and adrenal glands are normal in appearance. No hydroureteronephrosis in the visualized portions of the abdomen. Stomach/Bowel: Visualized portions are unremarkable. Vascular/Lymphatic: No aneurysm identified in the visualized abdominal vasculature. No lymphadenopathy noted in the visualized abdomen. Other: No significant volume of ascites noted in the visualized portions of the peritoneal cavity. Musculoskeletal: No aggressive appearing osseous lesions are noted in the visualized portions of the skeleton. IMPRESSION: 1. No choledocholithiasis or signs of biliary tract obstruction. 2. There is a subtle lesion adjacent to the gallbladder fossa which has unusual imaging characteristics. This is favored to represent a focal area of iron deposition within the hepatic parenchyma, likely related to prior cholecystectomy, with an associated perfusion deficit. Follow-up abdominal MRI with and without IV gadolinium is recommended in 6 months to ensure the stability of this finding. 3. Hepatic steatosis. Electronically Signed   By: Vinnie Langton M.D.   On: 05/10/2021 08:36  [4 week]   Impression: 50 y/o male with prior cholecystectomy in 2018, IBS, LBBB, DM, HTN presenting with acute onset upper abdominal  pain/ruq pain radiating into the back associated with N/V.  Abdominal pain/elevated LFTs/pancreatitis: Began less than 24 hours ago.  Noted to have AST 451, ALT 882, total bilirubin 5.4, lipase 1847.  CT and MR imaging consistent with pancreatitis with inflammation in the head and uncinate process.  No pancreatic duct dilation.  No biliary duct dilation.  No evidence of choledocholithiasis.  He has indeterminant lesion adjacent to the gallbladder fossa which will require additional imaging, recommended abdominal MRI with and without IV gadolinium in 6 months.  At this time his pain has completely resolved.  Suspect patient may have passed some sludge or a stone developing biliary pancreatitis.  He describes milder episode about a month ago and had at least one of his LFTs elevated at that time, those labs were not visible in epic.   Liver lesion: noted on MRI this admission. Adjacent to the gallbladder fossa. Indeterminate. Possible focal area of iron deposition within the hepatic parenchyma. Will need abdominal MRI with and without IV gadolinium in 6 months as per radiology recommendation.  Plan: Trend LFTs. Trial of clear liquids. IV fluids for management of acute pancreatitis.  Supportive measures.  Follow up with Dr. Paulita Fujita for liver lesion surveillance. Patient has follow up in 07/2021 to arrange for colonoscopy.   We would like to thank you for the opportunity to participate in the care of [facility].  Laureen Ochs. Bernarda Caffey [facility] [phone] 12/15/202210:47 AM      LOS: 0 days

## 2021-05-11 DIAGNOSIS — R7989 Other specified abnormal findings of blood chemistry: Secondary | ICD-10-CM

## 2021-05-11 DIAGNOSIS — K859 Acute pancreatitis without necrosis or infection, unspecified: Secondary | ICD-10-CM

## 2021-05-11 DIAGNOSIS — I447 Left bundle-branch block, unspecified: Secondary | ICD-10-CM

## 2021-05-11 LAB — CBC
HCT: 43.4 % (ref 39.0–52.0)
Hemoglobin: 14.6 g/dL (ref 13.0–17.0)
MCH: 30.2 pg (ref 26.0–34.0)
MCHC: 33.6 g/dL (ref 30.0–36.0)
MCV: 89.7 fL (ref 80.0–100.0)
Platelets: 166 10*3/uL (ref 150–400)
RBC: 4.84 MIL/uL (ref 4.22–5.81)
RDW: 12.8 % (ref 11.5–15.5)
WBC: 7.1 10*3/uL (ref 4.0–10.5)
nRBC: 0 % (ref 0.0–0.2)

## 2021-05-11 LAB — COMPREHENSIVE METABOLIC PANEL
ALT: 384 U/L — ABNORMAL HIGH (ref 0–44)
AST: 55 U/L — ABNORMAL HIGH (ref 15–41)
Albumin: 3.4 g/dL — ABNORMAL LOW (ref 3.5–5.0)
Alkaline Phosphatase: 114 U/L (ref 38–126)
Anion gap: 10 (ref 5–15)
BUN: 15 mg/dL (ref 6–20)
CO2: 25 mmol/L (ref 22–32)
Calcium: 8.6 mg/dL — ABNORMAL LOW (ref 8.9–10.3)
Chloride: 102 mmol/L (ref 98–111)
Creatinine, Ser: 1.09 mg/dL (ref 0.61–1.24)
GFR, Estimated: >60 mL/min (ref >60)
Glucose, Bld: 117 mg/dL — ABNORMAL HIGH (ref 70–99)
Potassium: 4 mmol/L (ref 3.5–5.1)
Sodium: 137 mmol/L (ref 135–145)
Total Bilirubin: 4.3 mg/dL — ABNORMAL HIGH (ref 0.3–1.2)
Total Protein: 6.5 g/dL (ref 6.5–8.1)

## 2021-05-11 LAB — LACTATE DEHYDROGENASE: LDH: 133 U/L (ref 98–192)

## 2021-05-11 NOTE — Progress Notes (Signed)
Subjective:  Feels good. Has not required pain medication. Pain gone since in the ED. Tolerated clear liquids. He is ready to go home. Wants to work 6 hours tomorrow.   Objective: Vital signs in last 24 hours: Temp:  [97.7 F (36.5 C)-98.4 F (36.9 C)] 97.7 F (36.5 C) (12/16 0556) Pulse Rate:  [84-94] 86 (12/16 0556) Resp:  [16-20] 16 (12/16 0556) BP: (101-133)/(66-88) 129/77 (12/16 0556) SpO2:  [92 %-97 %] 92 % (12/16 0556) Weight:  [83.3 kg] 83.3 kg (12/15 1818) Last BM Date: 05/08/21 General:   Alert,  Well-developed, well-nourished, pleasant and cooperative in NAD Eyes:  Sclera clear, very mild icterus.  Abdomen:  Soft, nontender and nondistended.  Extremities:  Without clubbing, deformity or edema. Neurologic:  Alert and  oriented x4;  grossly normal neurologically. Psych:  Alert and cooperative. Normal mood and affect.  Intake/Output from previous day: 12/15 0701 - 12/16 0700 In: 1378.7 [P.O.:240; I.V.:834.3; IV Piggyback:304.3] Out: 600 [Urine:600] Intake/Output this shift: No intake/output data recorded.  Lab Results: CBC Recent Labs    05/10/21 0229 05/11/21 0519  WBC 14.2* 7.1  HGB 16.7 14.6  HCT 49.2 43.4  MCV 86.9 89.7  PLT 216 166   BMET Recent Labs    05/10/21 0229 05/11/21 0519  NA 137 137  K 4.4 4.0  CL 100 102  CO2 26 25  GLUCOSE 175* 117*  BUN 18 15  CREATININE 1.07 1.09  CALCIUM 9.1 8.6*   LFTs Recent Labs    05/10/21 0229 05/10/21 1000 05/11/21 0519  BILITOT 5.4* 5.6* 4.3*  BILIDIR  --  0.9*  --   IBILI  --  4.7*  --   ALKPHOS 141*  --  114  AST 451*  --  55*  ALT 882*  --  384*  PROT 7.7  --  6.5  ALBUMIN 4.3  --  3.4*   Recent Labs    05/10/21 0229  LIPASE 1,847*   PT/INR No results for input(s): LABPROT, INR in the last 72 hours.    Imaging Studies: CT ABDOMEN PELVIS W CONTRAST  Result Date: 05/10/2021 CLINICAL DATA:  50 year old male with abdominal pain in the epigastric region radiating around to the back.  Emesis. Suspected acute pancreatitis. EXAM: CT ABDOMEN AND PELVIS WITH CONTRAST TECHNIQUE: Multidetector CT imaging of the abdomen and pelvis was performed using the standard protocol following bolus administration of intravenous contrast. CONTRAST:  OMNIPAQUE IOHEXOL 300 MG/ML  SOLN COMPARISON:  CT the abdomen and pelvis 10/27/2020. FINDINGS: Lower chest: Scattered areas of atelectasis and/or scarring are noted in the lung bases bilaterally. Hepatobiliary: No suspicious cystic or solid hepatic lesions. No intra or extrahepatic biliary ductal dilatation. Status post cholecystectomy. Pancreas: No pancreatic mass. No pancreatic ductal dilatation. Subtle inflammatory changes are noted in the fat adjacent to the head and uncinate process of the pancreas, suggesting early or mild acute pancreatitis. Pancreatic parenchyma enhances normally. No pancreatic or peripancreatic fluid collections to indicate pseudocyst at this time. Spleen: Unremarkable. Adrenals/Urinary Tract: Nonobstructive calculi are noted within both renal collecting systems measuring up to 6 mm in the lower pole collecting system of the left kidney. Bilateral kidneys and adrenal glands are otherwise normal in appearance. No hydroureteronephrosis. Urinary bladder is normal in appearance. Stomach/Bowel: The appearance of the stomach is normal. Inflammatory changes are noted adjacent to the duodenum, presumably secondary to adjacent pancreatitis. No pathologic dilatation of small bowel or colon. Normal appendix. Vascular/Lymphatic: No atherosclerotic disease, aneurysm or dissection noted in the abdominal or  pelvic vasculature. No lymphadenopathy noted in the abdomen or pelvis. Reproductive: Prostate gland and seminal vesicles are unremarkable in appearance. Other: Small right inguinal hernia containing only fat. No significant volume of ascites. No pneumoperitoneum. Musculoskeletal: There are no aggressive appearing lytic or blastic lesions noted in the  visualized portions of the skeleton. IMPRESSION: 1. Subtle inflammatory changes adjacent to the head and uncinate process of the pancreas, suggesting an acute pancreatitis. This appears uncomplicated at this time. 2. Nonobstructive calculi within the collecting systems of both kidneys measuring up to 6 mm in the lower pole collecting system of left kidney. No ureteral stones or findings of urinary tract obstruction are noted at this time. 3. Small right inguinal hernia containing only fat. No associated bowel incarceration or obstruction at this time. Electronically Signed   By: Trudie Reed M.D.   On: 05/10/2021 05:50   MR 3D Recon At Scanner  Result Date: 05/10/2021 CLINICAL DATA:  50 year old male with history of jaundice. EXAM: MRI ABDOMEN WITHOUT AND WITH CONTRAST (INCLUDING MRCP) TECHNIQUE: Multiplanar multisequence MR imaging of the abdomen was performed both before and after the administration of intravenous contrast. Heavily T2-weighted images of the biliary and pancreatic ducts were obtained, and three-dimensional MRCP images were rendered by post processing. CONTRAST:  7mL GADAVIST GADOBUTROL 1 MMOL/ML IV SOLN COMPARISON:  CT of the abdomen and pelvis 05/10/2021. Abdominal MRI 10/22/2016. FINDINGS: Lower chest: Unremarkable. Hepatobiliary: Mild diffuse loss of signal intensity throughout the hepatic parenchyma on out of phase dual echo images, indicative of hepatic steatosis. Adjacent to the gallbladder fossaa (axial image 42 of series 9) there is a T1 isointense, T2 hypointense lesion which is hypovascular on post gadolinium imaging measuring approximally 1.9 x 1.2 cm (axial image 44 of series 16). Patient is status post cholecystectomy. No intra or extrahepatic biliary ductal dilatation noted on MRCP images. Common bile duct measures 4 mm in the porta hepatis. No definite filling defects within the common bile duct to suggest the presence of choledocholithiasis. Pancreas: No pancreatic mass. No  pancreatic ductal dilatation noted on MRCP images. Mild T2 signal intensity in the peripancreatic fat adjacent to the head and uncinate process of the pancreas, suggestive of acute pancreatitis. Pancreatic parenchyma enhances normally. No pancreatic or peripancreatic fluid collections to suggest pseudocyst. Spleen:  Unremarkable. Adrenals/Urinary Tract: Bilateral kidneys and adrenal glands are normal in appearance. No hydroureteronephrosis in the visualized portions of the abdomen. Stomach/Bowel: Visualized portions are unremarkable. Vascular/Lymphatic: No aneurysm identified in the visualized abdominal vasculature. No lymphadenopathy noted in the visualized abdomen. Other: No significant volume of ascites noted in the visualized portions of the peritoneal cavity. Musculoskeletal: No aggressive appearing osseous lesions are noted in the visualized portions of the skeleton. IMPRESSION: 1. No choledocholithiasis or signs of biliary tract obstruction. 2. There is a subtle lesion adjacent to the gallbladder fossa which has unusual imaging characteristics. This is favored to represent a focal area of iron deposition within the hepatic parenchyma, likely related to prior cholecystectomy, with an associated perfusion deficit. Follow-up abdominal MRI with and without IV gadolinium is recommended in 6 months to ensure the stability of this finding. 3. Hepatic steatosis. Electronically Signed   By: Trudie Reed M.D.   On: 05/10/2021 08:36   US Abdomen Limited  Result Date: 05/10/2021 CLINICAL DATA:  Elevated liver function tests. EXAM: ULTRASOUND ABDOMEN LIMITED RIGHT UPPER QUADRANT COMPARISON:  Abdominal MRI and CT 05/10/2021 FINDINGS: Gallbladder: Surgically absent. Common bile duct: Diameter: 4 mm Liver: Diffusely increased parenchymal echogenicity without a focal  lesion identified. Portal vein is patent on color Doppler imaging with normal direction of blood flow towards the liver. Other: None. IMPRESSION: 1.  Hepatic steatosis. 2. Status post cholecystectomy. No biliary dilatation. Electronically Signed   By: Sebastian Ache M.D.   On: 05/10/2021 09:19   MR ABDOMEN MRCP W WO CONTAST  Result Date: 05/10/2021 CLINICAL DATA:  50 year old male with history of jaundice. EXAM: MRI ABDOMEN WITHOUT AND WITH CONTRAST (INCLUDING MRCP) TECHNIQUE: Multiplanar multisequence MR imaging of the abdomen was performed both before and after the administration of intravenous contrast. Heavily T2-weighted images of the biliary and pancreatic ducts were obtained, and three-dimensional MRCP images were rendered by post processing. CONTRAST:  37mL GADAVIST GADOBUTROL 1 MMOL/ML IV SOLN COMPARISON:  CT of the abdomen and pelvis 05/10/2021. Abdominal MRI 10/22/2016. FINDINGS: Lower chest: Unremarkable. Hepatobiliary: Mild diffuse loss of signal intensity throughout the hepatic parenchyma on out of phase dual echo images, indicative of hepatic steatosis. Adjacent to the gallbladder fossaa (axial image 42 of series 9) there is a T1 isointense, T2 hypointense lesion which is hypovascular on post gadolinium imaging measuring approximally 1.9 x 1.2 cm (axial image 44 of series 16). Patient is status post cholecystectomy. No intra or extrahepatic biliary ductal dilatation noted on MRCP images. Common bile duct measures 4 mm in the porta hepatis. No definite filling defects within the common bile duct to suggest the presence of choledocholithiasis. Pancreas: No pancreatic mass. No pancreatic ductal dilatation noted on MRCP images. Mild T2 signal intensity in the peripancreatic fat adjacent to the head and uncinate process of the pancreas, suggestive of acute pancreatitis. Pancreatic parenchyma enhances normally. No pancreatic or peripancreatic fluid collections to suggest pseudocyst. Spleen:  Unremarkable. Adrenals/Urinary Tract: Bilateral kidneys and adrenal glands are normal in appearance. No hydroureteronephrosis in the visualized portions of the  abdomen. Stomach/Bowel: Visualized portions are unremarkable. Vascular/Lymphatic: No aneurysm identified in the visualized abdominal vasculature. No lymphadenopathy noted in the visualized abdomen. Other: No significant volume of ascites noted in the visualized portions of the peritoneal cavity. Musculoskeletal: No aggressive appearing osseous lesions are noted in the visualized portions of the skeleton. IMPRESSION: 1. No choledocholithiasis or signs of biliary tract obstruction. 2. There is a subtle lesion adjacent to the gallbladder fossa which has unusual imaging characteristics. This is favored to represent a focal area of iron deposition within the hepatic parenchyma, likely related to prior cholecystectomy, with an associated perfusion deficit. Follow-up abdominal MRI with and without IV gadolinium is recommended in 6 months to ensure the stability of this finding. 3. Hepatic steatosis. Electronically Signed   By: Trudie Reed M.D.   On: 05/10/2021 08:36  [2 weeks]   Assessment:  50 y/o male with prior cholecystectomy in 2018, IBS, LBBB, DM, HTN presenting with acute onset upper abdominal pain/ruq pain radiating into the back associated with N/V.   Abdominal pain/elevated LFTs/pancreatitis: On presentation AST 451, ALT 882, total bilirubin 5.4, lipase 1847.  Imaging consistent with pancreatitis with inflammation at the head and uncinate process.  No pancreatic duct or biliary ductal dilation.  No evidence of choledocholithiasis.  LFTs improved today with AST down to 55, ALT down to 384, total bilirubin down to 4.3.  Acute hepatitis panel negative.  Possibly passed a stone. Suspect elevation of his bilirubin multifactorial, likely has baseline [place] given elevation is mostly due to indirect bili and no evidence of hemolysis. Discussed with patient and wife at length, he will need to be followed closely with his primary GI to verify LFTs  return to normal. He reports numbers were up about one month  ago (I don't have access to those numbers) and mildly elevated in 05/2020. If LFTs don't return to normal, he will need further work up. Will hold off additional serologies or hemochromatosis screening (ferritin will likely be elevated due to APR) while inpatient.   Liver lesions: noted on MRI this admission. Adjacent to the gallbladder fossa. Indeterminate. Possible focal area of iron deposition within the hepatic parenchyma. Will need abdominal MRI with and without IV gadolinium in 6 months as per radiology recommendation. Patient and wife aware to follow up with Dr. Dulce Sellar.   Plan: Low fat diet.  Trend LFTs. Patient to follow up with Dr. Dulce Sellar to get labs first of next week.  Outpatient surveillance abdominal MRI with and without IV gadolinium to be done by primary GI.  From a GI standpoint, stable for discharge, with close interval follow up.   Leanna Battles. [facility] [phone] 12/16/202210:15 AM    LOS: 1 day

## 2021-05-11 NOTE — Discharge Summary (Signed)
Physician Discharge Summary  Kyle [name] NGE:[id number] DOB: 1970-12-30 DOA: 05/10/2021  PCP: Salli Real, MD  Admit date: 05/10/2021 Discharge date: 05/11/2021  Admitted From: Home Disposition:  Home  Recommendations for Outpatient Follow-up:  Follow up with PCP in 1-2 weeks Please obtain BMP/CBC in one week     Discharge Condition: Stable CODE STATUS: FULL Diet recommendation: Heart Healthy / Carb Modified   Brief/Interim Summary: 50 y.o. male with medical history of DM2, hypertension, hyperlipidemia, left bundle branch block presenting with 1 day history of abdominal pain that began in the morning of 05/09/2021 after he ate some "cheez-itz".  He stated that he could not eat lunch.  He ate some dinner after which she had an episode of emesis without blood.  His pain initially improved, but came back.  As result, he presented to the emergency department for further evaluation.  Prior to his abdominal pain, the patient had been in his usual state of health.  He has not had any fevers, chills, headache, neck pain, chest pain, shortness of breath, coughing, hemoptysis, nausea, vomiting, diarrhea, hematochezia, melena.  There is no dysuria or hematuria.  The patient denies any new medications.  He drinks beer socially, but has not had a drink for a month.  He does not smoke.  He does not use any illicit drugs.  He had a cholecystectomy in 2018 performed by Dr. Franky Macho.  He went to see [facility] on 05/09/21 about his abd pain, and was prescribed pantoprazole and blood work was performed.  He stated that he had not been called about his blood work results. In the ED, the patient was afebrile hemodynamically stable with oxygen saturation 94-95% room air.  BMP shows sodium 137, potassium 4.4, bicarbonate 26, serum creatinine 1.07.  AST 451, ALT 882, alkaline phosphatase 141, total bilirubin 5.4, lipase 18 is 47.  WBC 14.2, hemoglobin 16.7, platelets 216,000.  UA was negative for any  pyuria.  CT abdomen showed subtle inflammation in the head and uncinate process of the pancreas.  There was no biliary ductal dilatation.  There was nonobstructive renal calculi.  The patient was started on IV fluids.  GI was consulted to assist with management.  Discharge Diagnoses:  Acute pancreatitis -remain npo initially -IVF -MRCP--No choledocholithiasis or signs of biliary tract obstruction. Subtle "lesion" adj to GB fossa, likely iron deposition--repeat MRI with and without contrast in 6 months -Judicious opioids -GI consult appreciated>>likely passed a gallstone -Check lipid panel--LDL 69, Trig 66 -05/10/2021 CT abdomen as discussed above -patient's pain subsided rather quickly -diet was advanced which pt tolerated -outpt follow up with Dr. Anthoney Harada -concerned about a degree of cholangitis -start empiric antibiotics -LFTs trending down -bili mostly indirect -LDH normal--doubt hemolysis -repeat LFTs in one week after d/c   Hypertension -Continue amlodipine   Hyperlipidemia -Continue statin   Hyperbilirubinemia -fractionate -bili mostly indirect -LDH normal--doubt hemolysis -repeat LFTs in one week after d/c   Diabetes mellitus type 2 -Holding metformin--restart after dc -05/30/2020 hemoglobin A1c 7.2 -NovoLog sliding scale   Left bundle branch block -Review of previous and current EKGs shows unchanged LBBB    Discharge Instructions   Allergies as of 05/11/2021       Reactions   Nitroglycerin Other (See Comments)   syncope        Medication List     STOP taking these medications    oxyCODONE-acetaminophen 5-325 MG tablet Commonly known as: PERCOCET/ROXICET   tamsulosin 0.4 MG Caps capsule Commonly  known as: Flomax       TAKE these medications    amLODipine 10 MG tablet Commonly known as: NORVASC TAKE 1 TABLET BY MOUTH DAILY. PLEASE MAKE APPT What changed: See the new instructions.   atorvastatin 20 MG tablet Commonly  known as: LIPITOR TAKE 1 TABLET BY MOUTH DAILY. PLEASE MAKE APPT What changed: See the new instructions.   ibuprofen 800 MG tablet Commonly known as: ADVIL Take 1 tablet (800 mg total) by mouth with breakfast, with lunch, and with evening meal.   metFORMIN 1000 MG tablet Commonly known as: GLUCOPHAGE Take 1,000 mg by mouth 2 (two) times daily. What changed: Another medication with the same name was removed. Continue taking this medication, and follow the directions you see here.   ondansetron 4 MG tablet Commonly known as: ZOFRAN Take 1 tablet (4 mg total) by mouth every 8 (eight) hours as needed for nausea or vomiting.        Allergies  Allergen Reactions   Nitroglycerin Other (See Comments)    syncope     Consultations: GI   Procedures/Studies: CT ABDOMEN PELVIS W CONTRAST  Result Date: 05/10/2021 CLINICAL DATA:  50 year old male with abdominal pain in the epigastric region radiating around to the back. Emesis. Suspected acute pancreatitis. EXAM: CT ABDOMEN AND PELVIS WITH CONTRAST TECHNIQUE: Multidetector CT imaging of the abdomen and pelvis was performed using the standard protocol following bolus administration of intravenous contrast. CONTRAST:  138mL OMNIPAQUE IOHEXOL 300 MG/ML  SOLN COMPARISON:  CT the abdomen and pelvis 10/27/2020. FINDINGS: Lower chest: Scattered areas of atelectasis and/or scarring are noted in the lung bases bilaterally. Hepatobiliary: No suspicious cystic or solid hepatic lesions. No intra or extrahepatic biliary ductal dilatation. Status post cholecystectomy. Pancreas: No pancreatic mass. No pancreatic ductal dilatation. Subtle inflammatory changes are noted in the fat adjacent to the head and uncinate process of the pancreas, suggesting early or mild acute pancreatitis. Pancreatic parenchyma enhances normally. No pancreatic or peripancreatic fluid collections to indicate pseudocyst at this time. Spleen: Unremarkable. Adrenals/Urinary Tract:  Nonobstructive calculi are noted within both renal collecting systems measuring up to 6 mm in the lower pole collecting system of the left kidney. Bilateral kidneys and adrenal glands are otherwise normal in appearance. No hydroureteronephrosis. Urinary bladder is normal in appearance. Stomach/Bowel: The appearance of the stomach is normal. Inflammatory changes are noted adjacent to the duodenum, presumably secondary to adjacent pancreatitis. No pathologic dilatation of small bowel or colon. Normal appendix. Vascular/Lymphatic: No atherosclerotic disease, aneurysm or dissection noted in the abdominal or pelvic vasculature. No lymphadenopathy noted in the abdomen or pelvis. Reproductive: Prostate gland and seminal vesicles are unremarkable in appearance. Other: Small right inguinal hernia containing only fat. No significant volume of ascites. No pneumoperitoneum. Musculoskeletal: There are no aggressive appearing lytic or blastic lesions noted in the visualized portions of the skeleton. IMPRESSION: 1. Subtle inflammatory changes adjacent to the head and uncinate process of the pancreas, suggesting an acute pancreatitis. This appears uncomplicated at this time. 2. Nonobstructive calculi within the collecting systems of both kidneys measuring up to 6 mm in the lower pole collecting system of left kidney. No ureteral stones or findings of urinary tract obstruction are noted at this time. 3. Small right inguinal hernia containing only fat. No associated bowel incarceration or obstruction at this time. Electronically Signed   By: Vinnie Langton M.D.   On: 05/10/2021 05:50   MR 3D Recon At Scanner  Result Date: 05/10/2021 CLINICAL DATA:  50 year old male with history  of jaundice. EXAM: MRI ABDOMEN WITHOUT AND WITH CONTRAST (INCLUDING MRCP) TECHNIQUE: Multiplanar multisequence MR imaging of the abdomen was performed both before and after the administration of intravenous contrast. Heavily T2-weighted images of the  biliary and pancreatic ducts were obtained, and three-dimensional MRCP images were rendered by post processing. CONTRAST:  71mL GADAVIST GADOBUTROL 1 MMOL/ML IV SOLN COMPARISON:  CT of the abdomen and pelvis 05/10/2021. Abdominal MRI 10/22/2016. FINDINGS: Lower chest: Unremarkable. Hepatobiliary: Mild diffuse loss of signal intensity throughout the hepatic parenchyma on out of phase dual echo images, indicative of hepatic steatosis. Adjacent to the gallbladder fossaa (axial image 42 of series 9) there is a T1 isointense, T2 hypointense lesion which is hypovascular on post gadolinium imaging measuring approximally 1.9 x 1.2 cm (axial image 44 of series 16). Patient is status post cholecystectomy. No intra or extrahepatic biliary ductal dilatation noted on MRCP images. Common bile duct measures 4 mm in the porta hepatis. No definite filling defects within the common bile duct to suggest the presence of choledocholithiasis. Pancreas: No pancreatic mass. No pancreatic ductal dilatation noted on MRCP images. Mild T2 signal intensity in the peripancreatic fat adjacent to the head and uncinate process of the pancreas, suggestive of acute pancreatitis. Pancreatic parenchyma enhances normally. No pancreatic or peripancreatic fluid collections to suggest pseudocyst. Spleen:  Unremarkable. Adrenals/Urinary Tract: Bilateral kidneys and adrenal glands are normal in appearance. No hydroureteronephrosis in the visualized portions of the abdomen. Stomach/Bowel: Visualized portions are unremarkable. Vascular/Lymphatic: No aneurysm identified in the visualized abdominal vasculature. No lymphadenopathy noted in the visualized abdomen. Other: No significant volume of ascites noted in the visualized portions of the peritoneal cavity. Musculoskeletal: No aggressive appearing osseous lesions are noted in the visualized portions of the skeleton. IMPRESSION: 1. No choledocholithiasis or signs of biliary tract obstruction. 2. There is a  subtle lesion adjacent to the gallbladder fossa which has unusual imaging characteristics. This is favored to represent a focal area of iron deposition within the hepatic parenchyma, likely related to prior cholecystectomy, with an associated perfusion deficit. Follow-up abdominal MRI with and without IV gadolinium is recommended in 6 months to ensure the stability of this finding. 3. Hepatic steatosis. Electronically Signed   By: Vinnie Langton M.D.   On: 05/10/2021 08:36   US Abdomen Limited  Result Date: 05/10/2021 CLINICAL DATA:  Elevated liver function tests. EXAM: ULTRASOUND ABDOMEN LIMITED RIGHT UPPER QUADRANT COMPARISON:  Abdominal MRI and CT 05/10/2021 FINDINGS: Gallbladder: Surgically absent. Common bile duct: Diameter: 4 mm Liver: Diffusely increased parenchymal echogenicity without a focal lesion identified. Portal vein is patent on color Doppler imaging with normal direction of blood flow towards the liver. Other: None. IMPRESSION: 1. Hepatic steatosis. 2. Status post cholecystectomy. No biliary dilatation. Electronically Signed   By: Logan Bores M.D.   On: 05/10/2021 09:19   MR ABDOMEN MRCP W WO CONTAST  Result Date: 05/10/2021 CLINICAL DATA:  50 year old male with history of jaundice. EXAM: MRI ABDOMEN WITHOUT AND WITH CONTRAST (INCLUDING MRCP) TECHNIQUE: Multiplanar multisequence MR imaging of the abdomen was performed both before and after the administration of intravenous contrast. Heavily T2-weighted images of the biliary and pancreatic ducts were obtained, and three-dimensional MRCP images were rendered by post processing. CONTRAST:  86mL GADAVIST GADOBUTROL 1 MMOL/ML IV SOLN COMPARISON:  CT of the abdomen and pelvis 05/10/2021. Abdominal MRI 10/22/2016. FINDINGS: Lower chest: Unremarkable. Hepatobiliary: Mild diffuse loss of signal intensity throughout the hepatic parenchyma on out of phase dual echo images, indicative of hepatic steatosis. Adjacent to the  gallbladder fossaa (axial  image 42 of series 9) there is a T1 isointense, T2 hypointense lesion which is hypovascular on post gadolinium imaging measuring approximally 1.9 x 1.2 cm (axial image 44 of series 16). Patient is status post cholecystectomy. No intra or extrahepatic biliary ductal dilatation noted on MRCP images. Common bile duct measures 4 mm in the porta hepatis. No definite filling defects within the common bile duct to suggest the presence of choledocholithiasis. Pancreas: No pancreatic mass. No pancreatic ductal dilatation noted on MRCP images. Mild T2 signal intensity in the peripancreatic fat adjacent to the head and uncinate process of the pancreas, suggestive of acute pancreatitis. Pancreatic parenchyma enhances normally. No pancreatic or peripancreatic fluid collections to suggest pseudocyst. Spleen:  Unremarkable. Adrenals/Urinary Tract: Bilateral kidneys and adrenal glands are normal in appearance. No hydroureteronephrosis in the visualized portions of the abdomen. Stomach/Bowel: Visualized portions are unremarkable. Vascular/Lymphatic: No aneurysm identified in the visualized abdominal vasculature. No lymphadenopathy noted in the visualized abdomen. Other: No significant volume of ascites noted in the visualized portions of the peritoneal cavity. Musculoskeletal: No aggressive appearing osseous lesions are noted in the visualized portions of the skeleton. IMPRESSION: 1. No choledocholithiasis or signs of biliary tract obstruction. 2. There is a subtle lesion adjacent to the gallbladder fossa which has unusual imaging characteristics. This is favored to represent a focal area of iron deposition within the hepatic parenchyma, likely related to prior cholecystectomy, with an associated perfusion deficit. Follow-up abdominal MRI with and without IV gadolinium is recommended in 6 months to ensure the stability of this finding. 3. Hepatic steatosis. Electronically Signed   By: Trudie Reedaniel  Entrikin M.D.   On: 05/10/2021 08:36         Discharge Exam: Vitals:   05/11/21 0556 05/11/21 1320  BP: 129/77 123/83  Pulse: 86 87  Resp: 16 20  Temp: 97.7 F (36.5 C) 98.2 F (36.8 C)  SpO2: 92% 96%   Vitals:   05/10/21 1818 05/10/21 2000 05/11/21 0556 05/11/21 1320  BP:  128/78 129/77 123/83  Pulse:  90 86 87  Resp:  20 16 20   Temp:  98.4 F (36.9 C) 97.7 F (36.5 C) 98.2 F (36.8 C)  TempSrc:  Oral Oral Oral  SpO2:  92% 92% 96%  Weight: 83.3 kg     Height: 5\' 8"  (1.727 m)       General: Pt is alert, awake, not in acute distress Cardiovascular: RRR, S1/S2 +, no rubs, no gallops Respiratory: CTA bilaterally, no wheezing, no rhonchi Abdominal: Soft, NT, ND, bowel sounds + Extremities: no edema, no cyanosis   The results of significant diagnostics from this hospitalization (including imaging, microbiology, ancillary and laboratory) are listed below for reference.    Significant Diagnostic Studies: CT ABDOMEN PELVIS W CONTRAST  Result Date: 05/10/2021 CLINICAL DATA:  50 year old male with abdominal pain in the epigastric region radiating around to the back. Emesis. Suspected acute pancreatitis. EXAM: CT ABDOMEN AND PELVIS WITH CONTRAST TECHNIQUE: Multidetector CT imaging of the abdomen and pelvis was performed using the standard protocol following bolus administration of intravenous contrast. CONTRAST:  100mL OMNIPAQUE IOHEXOL 300 MG/ML  SOLN COMPARISON:  CT the abdomen and pelvis 10/27/2020. FINDINGS: Lower chest: Scattered areas of atelectasis and/or scarring are noted in the lung bases bilaterally. Hepatobiliary: No suspicious cystic or solid hepatic lesions. No intra or extrahepatic biliary ductal dilatation. Status post cholecystectomy. Pancreas: No pancreatic mass. No pancreatic ductal dilatation. Subtle inflammatory changes are noted in the fat adjacent to the head and uncinate  process of the pancreas, suggesting early or mild acute pancreatitis. Pancreatic parenchyma enhances normally. No pancreatic or  peripancreatic fluid collections to indicate pseudocyst at this time. Spleen: Unremarkable. Adrenals/Urinary Tract: Nonobstructive calculi are noted within both renal collecting systems measuring up to 6 mm in the lower pole collecting system of the left kidney. Bilateral kidneys and adrenal glands are otherwise normal in appearance. No hydroureteronephrosis. Urinary bladder is normal in appearance. Stomach/Bowel: The appearance of the stomach is normal. Inflammatory changes are noted adjacent to the duodenum, presumably secondary to adjacent pancreatitis. No pathologic dilatation of small bowel or colon. Normal appendix. Vascular/Lymphatic: No atherosclerotic disease, aneurysm or dissection noted in the abdominal or pelvic vasculature. No lymphadenopathy noted in the abdomen or pelvis. Reproductive: Prostate gland and seminal vesicles are unremarkable in appearance. Other: Small right inguinal hernia containing only fat. No significant volume of ascites. No pneumoperitoneum. Musculoskeletal: There are no aggressive appearing lytic or blastic lesions noted in the visualized portions of the skeleton. IMPRESSION: 1. Subtle inflammatory changes adjacent to the head and uncinate process of the pancreas, suggesting an acute pancreatitis. This appears uncomplicated at this time. 2. Nonobstructive calculi within the collecting systems of both kidneys measuring up to 6 mm in the lower pole collecting system of left kidney. No ureteral stones or findings of urinary tract obstruction are noted at this time. 3. Small right inguinal hernia containing only fat. No associated bowel incarceration or obstruction at this time. Electronically Signed   By: Vinnie Langton M.D.   On: 05/10/2021 05:50   MR 3D Recon At Scanner  Result Date: 05/10/2021 CLINICAL DATA:  50 year old male with history of jaundice. EXAM: MRI ABDOMEN WITHOUT AND WITH CONTRAST (INCLUDING MRCP) TECHNIQUE: Multiplanar multisequence MR imaging of the abdomen  was performed both before and after the administration of intravenous contrast. Heavily T2-weighted images of the biliary and pancreatic ducts were obtained, and three-dimensional MRCP images were rendered by post processing. CONTRAST:  53mL GADAVIST GADOBUTROL 1 MMOL/ML IV SOLN COMPARISON:  CT of the abdomen and pelvis 05/10/2021. Abdominal MRI 10/22/2016. FINDINGS: Lower chest: Unremarkable. Hepatobiliary: Mild diffuse loss of signal intensity throughout the hepatic parenchyma on out of phase dual echo images, indicative of hepatic steatosis. Adjacent to the gallbladder fossaa (axial image 42 of series 9) there is a T1 isointense, T2 hypointense lesion which is hypovascular on post gadolinium imaging measuring approximally 1.9 x 1.2 cm (axial image 44 of series 16). Patient is status post cholecystectomy. No intra or extrahepatic biliary ductal dilatation noted on MRCP images. Common bile duct measures 4 mm in the porta hepatis. No definite filling defects within the common bile duct to suggest the presence of choledocholithiasis. Pancreas: No pancreatic mass. No pancreatic ductal dilatation noted on MRCP images. Mild T2 signal intensity in the peripancreatic fat adjacent to the head and uncinate process of the pancreas, suggestive of acute pancreatitis. Pancreatic parenchyma enhances normally. No pancreatic or peripancreatic fluid collections to suggest pseudocyst. Spleen:  Unremarkable. Adrenals/Urinary Tract: Bilateral kidneys and adrenal glands are normal in appearance. No hydroureteronephrosis in the visualized portions of the abdomen. Stomach/Bowel: Visualized portions are unremarkable. Vascular/Lymphatic: No aneurysm identified in the visualized abdominal vasculature. No lymphadenopathy noted in the visualized abdomen. Other: No significant volume of ascites noted in the visualized portions of the peritoneal cavity. Musculoskeletal: No aggressive appearing osseous lesions are noted in the visualized portions  of the skeleton. IMPRESSION: 1. No choledocholithiasis or signs of biliary tract obstruction. 2. There is a subtle lesion adjacent to the gallbladder  fossa which has unusual imaging characteristics. This is favored to represent a focal area of iron deposition within the hepatic parenchyma, likely related to prior cholecystectomy, with an associated perfusion deficit. Follow-up abdominal MRI with and without IV gadolinium is recommended in 6 months to ensure the stability of this finding. 3. Hepatic steatosis. Electronically Signed   By: Vinnie Langton M.D.   On: 05/10/2021 08:36   US Abdomen Limited  Result Date: 05/10/2021 CLINICAL DATA:  Elevated liver function tests. EXAM: ULTRASOUND ABDOMEN LIMITED RIGHT UPPER QUADRANT COMPARISON:  Abdominal MRI and CT 05/10/2021 FINDINGS: Gallbladder: Surgically absent. Common bile duct: Diameter: 4 mm Liver: Diffusely increased parenchymal echogenicity without a focal lesion identified. Portal vein is patent on color Doppler imaging with normal direction of blood flow towards the liver. Other: None. IMPRESSION: 1. Hepatic steatosis. 2. Status post cholecystectomy. No biliary dilatation. Electronically Signed   By: Logan Bores M.D.   On: 05/10/2021 09:19   MR ABDOMEN MRCP W WO CONTAST  Result Date: 05/10/2021 CLINICAL DATA:  50 year old male with history of jaundice. EXAM: MRI ABDOMEN WITHOUT AND WITH CONTRAST (INCLUDING MRCP) TECHNIQUE: Multiplanar multisequence MR imaging of the abdomen was performed both before and after the administration of intravenous contrast. Heavily T2-weighted images of the biliary and pancreatic ducts were obtained, and three-dimensional MRCP images were rendered by post processing. CONTRAST:  76mL GADAVIST GADOBUTROL 1 MMOL/ML IV SOLN COMPARISON:  CT of the abdomen and pelvis 05/10/2021. Abdominal MRI 10/22/2016. FINDINGS: Lower chest: Unremarkable. Hepatobiliary: Mild diffuse loss of signal intensity throughout the hepatic parenchyma on  out of phase dual echo images, indicative of hepatic steatosis. Adjacent to the gallbladder fossaa (axial image 42 of series 9) there is a T1 isointense, T2 hypointense lesion which is hypovascular on post gadolinium imaging measuring approximally 1.9 x 1.2 cm (axial image 44 of series 16). Patient is status post cholecystectomy. No intra or extrahepatic biliary ductal dilatation noted on MRCP images. Common bile duct measures 4 mm in the porta hepatis. No definite filling defects within the common bile duct to suggest the presence of choledocholithiasis. Pancreas: No pancreatic mass. No pancreatic ductal dilatation noted on MRCP images. Mild T2 signal intensity in the peripancreatic fat adjacent to the head and uncinate process of the pancreas, suggestive of acute pancreatitis. Pancreatic parenchyma enhances normally. No pancreatic or peripancreatic fluid collections to suggest pseudocyst. Spleen:  Unremarkable. Adrenals/Urinary Tract: Bilateral kidneys and adrenal glands are normal in appearance. No hydroureteronephrosis in the visualized portions of the abdomen. Stomach/Bowel: Visualized portions are unremarkable. Vascular/Lymphatic: No aneurysm identified in the visualized abdominal vasculature. No lymphadenopathy noted in the visualized abdomen. Other: No significant volume of ascites noted in the visualized portions of the peritoneal cavity. Musculoskeletal: No aggressive appearing osseous lesions are noted in the visualized portions of the skeleton. IMPRESSION: 1. No choledocholithiasis or signs of biliary tract obstruction. 2. There is a subtle lesion adjacent to the gallbladder fossa which has unusual imaging characteristics. This is favored to represent a focal area of iron deposition within the hepatic parenchyma, likely related to prior cholecystectomy, with an associated perfusion deficit. Follow-up abdominal MRI with and without IV gadolinium is recommended in 6 months to ensure the stability of this  finding. 3. Hepatic steatosis. Electronically Signed   By: Vinnie Langton M.D.   On: 05/10/2021 08:36    Microbiology: Recent Results (from the past 240 hour(s))  Culture, blood (routine x 2)     Status: None (Preliminary result)   Collection Time: 05/10/21 10:00 AM  Specimen: BLOOD  Result Value Ref Range Status   Specimen Description BLOOD RIGHT ANTECUBITAL  Final   Special Requests   Final    BOTTLES DRAWN AEROBIC AND ANAEROBIC Blood Culture adequate volume   Culture   Final    NO GROWTH < 24 HOURS Performed at [facility], [address], [place], [place] [address]    Report Status PENDING  Incomplete  Culture, blood (routine x 2)     Status: None (Preliminary result)   Collection Time: 05/10/21 10:00 AM   Specimen: BLOOD  Result Value Ref Range Status   Specimen Description BLOOD LEFT ANTECUBITAL  Final   Special Requests   Final    BOTTLES DRAWN AEROBIC AND ANAEROBIC Blood Culture adequate volume   Culture   Final    NO GROWTH < 24 HOURS Performed at [facility], [address] [place], [employer] [address]    Report Status PENDING  Incomplete     Labs: Basic Metabolic Panel: Recent Labs  Lab 05/10/21 0229 05/11/21 0519  NA 137 137  K 4.4 4.0  CL 100 102  CO2 26 25  GLUCOSE 175* 117*  BUN 18 15  CREATININE 1.07 1.09  CALCIUM 9.1 8.6*   Liver Function Tests: Recent Labs  Lab 05/10/21 0229 05/10/21 1000 05/11/21 0519  AST 451*  --  55*  ALT 882*  --  384*  ALKPHOS 141*  --  114  BILITOT 5.4* 5.6* 4.3*  PROT 7.7  --  6.5  ALBUMIN 4.3  --  3.4*   Recent Labs  Lab 05/10/21 0229  LIPASE 1,847*   No results for input(s): AMMONIA in the last 168 hours. CBC: Recent Labs  Lab 05/10/21 0229 05/11/21 0519  WBC 14.2* 7.1  HGB 16.7 14.6  HCT 49.2 43.4  MCV 86.9 89.7  PLT 216 166   Cardiac Enzymes: No results for input(s): CKTOTAL, CKMB, CKMBINDEX, TROPONINI in the last 168 hours. BNP: Invalid input(s): POCBNP CBG: No results for input(s):  GLUCAP in the last 168 hours.  Time coordinating discharge:  36 minutes  Signed:  Orson Eva, DO [facility] Pager: [phone] 05/11/2021, 1:20 PM

## 2021-05-11 NOTE — Progress Notes (Signed)
Nsg Discharge Note  Admit Date:  05/10/2021 Discharge date: 05/11/2021   Tish Frederickson to be D/C'd Home per MD order.  AVS completed.   Patient/caregiver able to verbalize understanding.  Discharge Medication: Allergies as of 05/11/2021       Reactions   Nitroglycerin Other (See Comments)   syncope        Medication List     STOP taking these medications    oxyCODONE-acetaminophen 5-325 MG tablet Commonly known as: PERCOCET/ROXICET   tamsulosin 0.4 MG Caps capsule Commonly known as: Flomax       TAKE these medications    amLODipine 10 MG tablet Commonly known as: NORVASC TAKE 1 TABLET BY MOUTH DAILY. PLEASE MAKE APPT What changed: See the new instructions.   atorvastatin 20 MG tablet Commonly known as: LIPITOR TAKE 1 TABLET BY MOUTH DAILY. PLEASE MAKE APPT What changed: See the new instructions.   ibuprofen 800 MG tablet Commonly known as: ADVIL Take 1 tablet (800 mg total) by mouth with breakfast, with lunch, and with evening meal.   metFORMIN 1000 MG tablet Commonly known as: GLUCOPHAGE Take 1,000 mg by mouth 2 (two) times daily. What changed: Another medication with the same name was removed. Continue taking this medication, and follow the directions you see here.   ondansetron 4 MG tablet Commonly known as: ZOFRAN Take 1 tablet (4 mg total) by mouth every 8 (eight) hours as needed for nausea or vomiting.        Discharge Assessment: Vitals:   05/11/21 0556 05/11/21 1320  BP: 129/77 123/83  Pulse: 86 87  Resp: 16 20  Temp: 97.7 F (36.5 C) 98.2 F (36.8 C)  SpO2: 92% 96%   Skin clean, dry and intact without evidence of skin break down, no evidence of skin tears noted. IV catheter discontinued intact. Site without signs and symptoms of complications - no redness or edema noted at insertion site, patient denies c/o pain - only slight tenderness at site.  Dressing with slight pressure applied.  D/c Instructions-Education: Discharge  instructions given to patient/family with verbalized understanding. D/c education completed with patient/family including follow up instructions, medication list, d/c activities limitations if indicated, with other d/c instructions as indicated by MD - patient able to verbalize understanding, all questions fully answered. Patient instructed to return to ED, call 911, or call MD for any changes in condition.  Patient escorted via WC, and D/C home via private auto.  Verl Dicker, RN 05/11/2021 1:38 PM

## 2021-05-11 NOTE — Progress Notes (Signed)
°  Transition of Care [facility]) Screening Note   Patient Details  Name: DEZMON CONOVER Date of Birth: 25-Oct-1970   Transition of Care [facility]) CM/SW Contact:    Elliot Gault, LCSW Phone Number: 05/11/2021, 1:37 PM    Transition of Care Department [facility]) has reviewed patient and no TOC needs have been identified at this time. Will continue to monitor patient advancement through interdisciplinary progression rounds. If new patient transition needs arise, please place a TOC consult.

## 2021-05-12 LAB — HAPTOGLOBIN: Haptoglobin: 112 mg/dL (ref 29–370)

## 2021-05-16 LAB — CULTURE, BLOOD (ROUTINE X 2)
Culture: NO GROWTH
Culture: NO GROWTH
Special Requests: ADEQUATE
Special Requests: ADEQUATE

## 2021-06-04 ENCOUNTER — Emergency Department (HOSPITAL_COMMUNITY)
Admission: EM | Admit: 2021-06-04 | Discharge: 2021-06-04 | Disposition: A | Discharge status: Home / Self Care | Payer: Federal, State, Local not specified - PPO | Attending: Emergency Medicine | Admitting: Emergency Medicine

## 2021-06-04 ENCOUNTER — Encounter (HOSPITAL_COMMUNITY): Payer: Self-pay

## 2021-06-04 ENCOUNTER — Emergency Department (HOSPITAL_COMMUNITY): Payer: Federal, State, Local not specified - PPO

## 2021-06-04 ENCOUNTER — Other Ambulatory Visit: Payer: Self-pay

## 2021-06-04 DIAGNOSIS — Z79899 Other long term (current) drug therapy: Secondary | ICD-10-CM | POA: Diagnosis not present

## 2021-06-04 DIAGNOSIS — R109 Unspecified abdominal pain: Secondary | ICD-10-CM

## 2021-06-04 DIAGNOSIS — R1031 Right lower quadrant pain: Secondary | ICD-10-CM | POA: Diagnosis present

## 2021-06-04 DIAGNOSIS — I10 Essential (primary) hypertension: Secondary | ICD-10-CM | POA: Insufficient documentation

## 2021-06-04 DIAGNOSIS — N132 Hydronephrosis with renal and ureteral calculous obstruction: Secondary | ICD-10-CM | POA: Insufficient documentation

## 2021-06-04 DIAGNOSIS — Z7984 Long term (current) use of oral hypoglycemic drugs: Secondary | ICD-10-CM | POA: Insufficient documentation

## 2021-06-04 DIAGNOSIS — N200 Calculus of kidney: Secondary | ICD-10-CM

## 2021-06-04 LAB — CBC WITH DIFFERENTIAL/PLATELET
Abs Immature Granulocytes: 0.05 10*3/uL (ref 0.00–0.07)
Basophils Absolute: 0 10*3/uL (ref 0.0–0.1)
Basophils Relative: 0 %
Eosinophils Absolute: 0 10*3/uL (ref 0.0–0.5)
Eosinophils Relative: 0 %
HCT: 48.6 % (ref 39.0–52.0)
Hemoglobin: 16.6 g/dL (ref 13.0–17.0)
Immature Granulocytes: 1 %
Lymphocytes Relative: 9 %
Lymphs Abs: 0.9 10*3/uL (ref 0.7–4.0)
MCH: 29.8 pg (ref 26.0–34.0)
MCHC: 34.2 g/dL (ref 30.0–36.0)
MCV: 87.3 fL (ref 80.0–100.0)
Monocytes Absolute: 0.5 10*3/uL (ref 0.1–1.0)
Monocytes Relative: 5 %
Neutro Abs: 7.9 10*3/uL — ABNORMAL HIGH (ref 1.7–7.7)
Neutrophils Relative %: 85 %
Platelets: 200 10*3/uL (ref 150–400)
RBC: 5.57 MIL/uL (ref 4.22–5.81)
RDW: 12.6 % (ref 11.5–15.5)
WBC: 9.4 10*3/uL (ref 4.0–10.5)
nRBC: 0 % (ref 0.0–0.2)

## 2021-06-04 LAB — LIPASE, BLOOD: Lipase: 151 U/L — ABNORMAL HIGH (ref 11–51)

## 2021-06-04 LAB — COMPREHENSIVE METABOLIC PANEL
ALT: 30 U/L (ref 0–44)
AST: 19 U/L (ref 15–41)
Albumin: 4.6 g/dL (ref 3.5–5.0)
Alkaline Phosphatase: 51 U/L (ref 38–126)
Anion gap: 10 (ref 5–15)
BUN: 14 mg/dL (ref 6–20)
CO2: 25 mmol/L (ref 22–32)
Calcium: 9.2 mg/dL (ref 8.9–10.3)
Chloride: 102 mmol/L (ref 98–111)
Creatinine, Ser: 1.09 mg/dL (ref 0.61–1.24)
GFR, Estimated: >60 mL/min (ref >60)
Glucose, Bld: 174 mg/dL — ABNORMAL HIGH (ref 70–99)
Potassium: 3.9 mmol/L (ref 3.5–5.1)
Sodium: 137 mmol/L (ref 135–145)
Total Bilirubin: 1.3 mg/dL — ABNORMAL HIGH (ref 0.3–1.2)
Total Protein: 7.8 g/dL (ref 6.5–8.1)

## 2021-06-04 MED ORDER — ONDANSETRON HCL 4 MG/2ML IJ SOLN
4.0000 mg | Freq: Once | INTRAMUSCULAR | Status: AC
  Administered 2021-06-04: 4 mg via INTRAVENOUS
  Filled 2021-06-04: qty 2

## 2021-06-04 MED ORDER — ONDANSETRON 4 MG PO TBDP: 4.0000 mg | ORAL_TABLET | Freq: Three times a day (TID) | ORAL | 0 refills | Status: DC | PRN

## 2021-06-04 MED ORDER — MORPHINE SULFATE (PF) 4 MG/ML IV SOLN
4.0000 mg | Freq: Once | INTRAVENOUS | Status: AC
  Administered 2021-06-04: 4 mg via INTRAVENOUS
  Filled 2021-06-04: qty 1

## 2021-06-04 MED ORDER — OXYCODONE-ACETAMINOPHEN 5-325 MG PO TABS: 1.0000 | ORAL_TABLET | Freq: Three times a day (TID) | ORAL | 0 refills | Status: DC | PRN

## 2021-06-04 NOTE — ED Triage Notes (Signed)
Pt to ED from home c/o lower mid abdominal pain that radiates to right flank since 130 this morning.

## 2021-06-04 NOTE — ED Provider Notes (Addendum)
[facility] EMERGENCY DEPARTMENT Provider Note   CSN: [id number] Arrival date & time: 06/04/21  4098     History  Chief Complaint  Patient presents with   Flank Pain    Kyle [name] is a 51 y.o. male.  The history is provided by the patient.  Flank Pain He has history of hypertension, hyperlipidemia, pancreatitis, status postcholecystectomy, kidney stones and comes in with pain in the right flank radiating to the right lower quadrant and suprapubic area.  Pain started suddenly at 1 AM.  There was associated nausea and dry heaves but no vomiting.  He denies fever, chills, sweats.  He denies any urinary difficulty and has not noted any blood in his urine.  Pain is rated at 6/10.  He has not taken anything for his pain.   Home Medications Prior to Admission medications   Medication Sig Start Date End Date Taking? Authorizing Provider  amLODipine (NORVASC) 10 MG tablet TAKE 1 TABLET BY MOUTH DAILY. PLEASE MAKE APPT Patient taking differently: Take 10 mg by mouth daily. 03/19/21   Jake Bathe, MD  atorvastatin (LIPITOR) 20 MG tablet TAKE 1 TABLET BY MOUTH DAILY. PLEASE MAKE APPT Patient taking differently: Take 20 mg by mouth daily. 03/19/21   Jake Bathe, MD  ibuprofen (ADVIL) 800 MG tablet Take 1 tablet (800 mg total) by mouth with breakfast, with lunch, and with evening meal. 10/27/20   Caccavale, Sophia, PA-C  metFORMIN (GLUCOPHAGE) 1000 MG tablet Take 1,000 mg by mouth 2 (two) times daily. 03/30/21   [provider]  ondansetron (ZOFRAN) 4 MG tablet Take 1 tablet (4 mg total) by mouth every 8 (eight) hours as needed for nausea or vomiting. 10/27/20   Caccavale, Sophia, PA-C      Allergies    Nitroglycerin    Review of Systems   Review of Systems  Genitourinary:  Positive for flank pain.  All other systems reviewed and are negative.  Physical Exam Updated Vital Signs BP (!) 147/91 (BP Location: Right Arm)    Pulse 83    Temp 97.8 F (36.6 C) (Oral)    Resp 18     Ht 5\' 8"  (1.727 m)    Wt 83 kg    SpO2 100%    BMI 27.82 kg/m  Physical Exam Vitals and nursing note reviewed.  51 year old male, resting comfortably and in no acute distress. Vital signs are significant for elevated blood pressure. Oxygen saturation is 100%, which is normal. Head is normocephalic and atraumatic. PERRLA, EOMI. Oropharynx is clear. Neck is nontender and supple without adenopathy or JVD. Back is nontender in the midline.  There is mild right CVA tenderness. Lungs are clear without rales, wheezes, or rhonchi. Chest is nontender. Heart has regular rate and rhythm without murmur. Abdomen is soft, flat, nontender without masses or hepatosplenomegaly and peristalsis is normoactive. Extremities have no cyanosis or edema, full range of motion is present. Skin is warm and dry without rash. Neurologic: Mental status is normal, cranial nerves are intact, moves all extremities equally.  ED Results / Procedures / Treatments   Labs (all labs ordered are listed, but only abnormal results are displayed) Labs Reviewed  COMPREHENSIVE METABOLIC PANEL  LIPASE, BLOOD  URINALYSIS, ROUTINE W REFLEX MICROSCOPIC  CBC WITH DIFFERENTIAL/PLATELET   Radiology No results found.  Procedures Procedures  Dr. 44  Medications Ordered in ED Medications  ondansetron [facility]) injection 4 mg (has no administration in time range)  morphine 4 MG/ML injection 4 mg (  has no administration in time range)    ED Course/ Medical Decision Making/ A&P                           Medical Decision Making  Right flank pain suspicious for kidney stone and renal colic.  Differential diagnosis includes, but is not limited to, pyelonephritis, diverticulitis, appendicitis, pancreatitis.  Patient does state that he is concerned about possible appendicitis.  Old records were reviewed showing hospitalization last month for acute pancreatitis, CT scan showing presence of bilateral renal calculi which were up to 6  mm.  We will check screening labs, urinalysis and sent for renal stone protocol CT scan.  Case is signed out to Dr. Rubin Payor.        Final Clinical Impression(s) / ED Diagnoses Final diagnoses:  Right flank pain    Rx / DC Orders ED Discharge Orders     None         Dione Booze, MD 06/04/21 4496    Dione Booze, MD 06/04/21 [phone]

## 2021-06-04 NOTE — ED Provider Notes (Signed)
°  Physical Exam  BP 125/64    Pulse 87    Temp 97.8 F (36.6 C) (Oral)    Resp 16    Ht 5\' 8"  (1.727 m)    Wt 83 kg    SpO2 93%    BMI 27.82 kg/m   Physical Exam  Procedures  Procedures  ED Course / MDM    Medical Decision Making  Received patient in signout.  Flank pain.  Kidney stone with history of same.  Does have hydroureter hydronephrosis on right.  No clear stone seen in the ureter.  Potential stone at left UVJ.  Also considered that it could be in the bladder as passed from the right side.  Pain improved.  Does have a stones in both kidneys.  No infection.  Will discharge home with urology follow-up.  We will give some pain medicine to help if he has more episodes like this.       Davonna Belling, MD 06/04/21 [phone]

## 2022-06-18 DIAGNOSIS — H35033 Hypertensive retinopathy, bilateral: Secondary | ICD-10-CM | POA: Diagnosis not present

## 2022-07-15 DIAGNOSIS — E7800 Pure hypercholesterolemia, unspecified: Secondary | ICD-10-CM | POA: Diagnosis not present

## 2022-07-15 DIAGNOSIS — E119 Type 2 diabetes mellitus without complications: Secondary | ICD-10-CM | POA: Diagnosis not present

## 2022-07-15 DIAGNOSIS — I10 Essential (primary) hypertension: Secondary | ICD-10-CM | POA: Diagnosis not present

## 2022-07-15 DIAGNOSIS — R030 Elevated blood-pressure reading, without diagnosis of hypertension: Secondary | ICD-10-CM | POA: Diagnosis not present

## 2022-07-15 DIAGNOSIS — Z6828 Body mass index (BMI) 28.0-28.9, adult: Secondary | ICD-10-CM | POA: Diagnosis not present

## 2022-07-15 DIAGNOSIS — Z79899 Other long term (current) drug therapy: Secondary | ICD-10-CM | POA: Diagnosis not present

## 2023-02-19 DIAGNOSIS — E7800 Pure hypercholesterolemia, unspecified: Secondary | ICD-10-CM | POA: Diagnosis not present

## 2023-02-19 DIAGNOSIS — R5383 Other fatigue: Secondary | ICD-10-CM | POA: Diagnosis not present

## 2023-02-19 DIAGNOSIS — E559 Vitamin D deficiency, unspecified: Secondary | ICD-10-CM | POA: Diagnosis not present

## 2023-02-19 DIAGNOSIS — Z0000 Encounter for general adult medical examination without abnormal findings: Secondary | ICD-10-CM | POA: Diagnosis not present

## 2023-02-19 DIAGNOSIS — R030 Elevated blood-pressure reading, without diagnosis of hypertension: Secondary | ICD-10-CM | POA: Diagnosis not present

## 2023-02-19 DIAGNOSIS — I10 Essential (primary) hypertension: Secondary | ICD-10-CM | POA: Diagnosis not present

## 2023-02-19 DIAGNOSIS — E119 Type 2 diabetes mellitus without complications: Secondary | ICD-10-CM | POA: Diagnosis not present

## 2023-02-19 DIAGNOSIS — Z6827 Body mass index (BMI) 27.0-27.9, adult: Secondary | ICD-10-CM | POA: Diagnosis not present

## 2023-03-20 ENCOUNTER — Ambulatory Visit: Payer: Federal, State, Local not specified - PPO | Admitting: Nurse Practitioner

## 2023-07-04 DIAGNOSIS — Z79899 Other long term (current) drug therapy: Secondary | ICD-10-CM | POA: Diagnosis not present

## 2023-07-04 DIAGNOSIS — E119 Type 2 diabetes mellitus without complications: Secondary | ICD-10-CM | POA: Diagnosis not present

## 2023-07-04 DIAGNOSIS — E7800 Pure hypercholesterolemia, unspecified: Secondary | ICD-10-CM | POA: Diagnosis not present

## 2023-07-04 DIAGNOSIS — R030 Elevated blood-pressure reading, without diagnosis of hypertension: Secondary | ICD-10-CM | POA: Diagnosis not present

## 2023-07-04 DIAGNOSIS — I10 Essential (primary) hypertension: Secondary | ICD-10-CM | POA: Diagnosis not present

## 2023-07-04 DIAGNOSIS — E559 Vitamin D deficiency, unspecified: Secondary | ICD-10-CM | POA: Diagnosis not present

## 2023-07-09 IMAGING — CT CT RENAL STONE PROTOCOL
2 of 4 series · 16 of 46 positions shown, 18 images · non-contrast
Comparison: 05/10/2021

CLINICAL DATA: Right flank pain increasing since 2972 hours.

EXAM:
CT ABDOMEN AND PELVIS WITHOUT CONTRAST
TECHNIQUE: Multidetector CT imaging of the abdomen and pelvis was performed
following the standard protocol without IV contrast.

[Series 2: axial st · axial · 0.82mm/px · z∈[+648,+1128]mm · 13 of 109 slices shown, 15 images]
[im 7/109  soft-tissue]
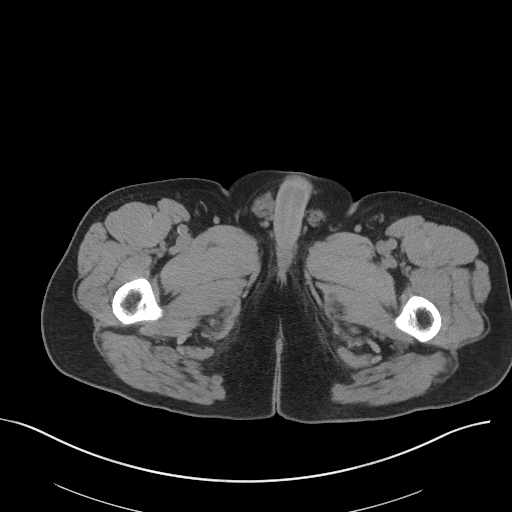
[im 7/109  bone]
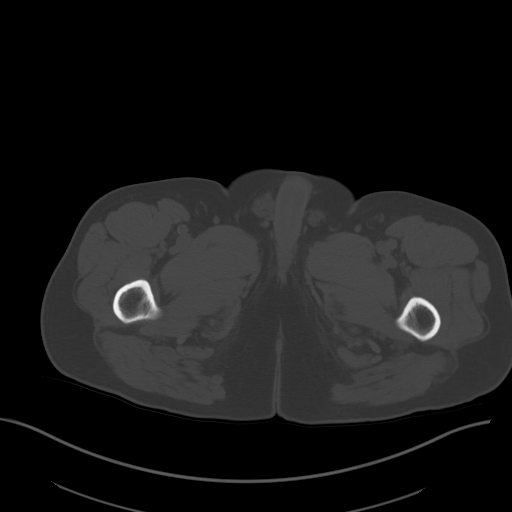
[im 13/109  soft-tissue]
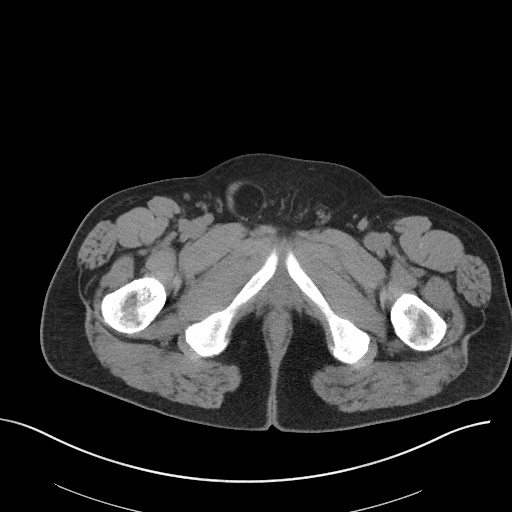
[im 25/109  soft-tissue]
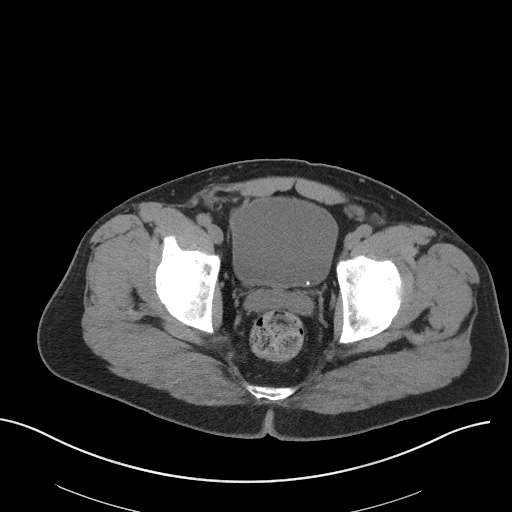
[im 31/109  soft-tissue]
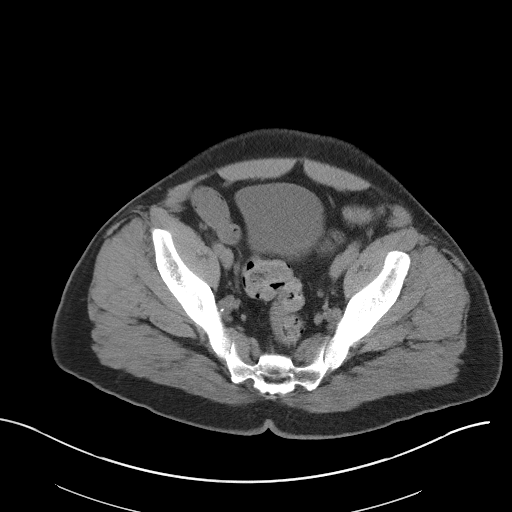
[im 37/109  soft-tissue]
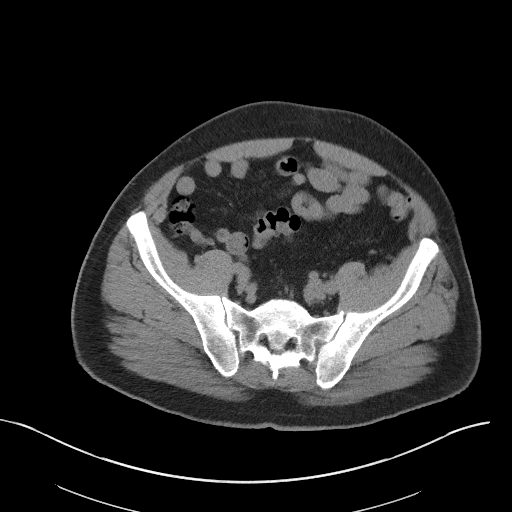
[im 49/109  soft-tissue]
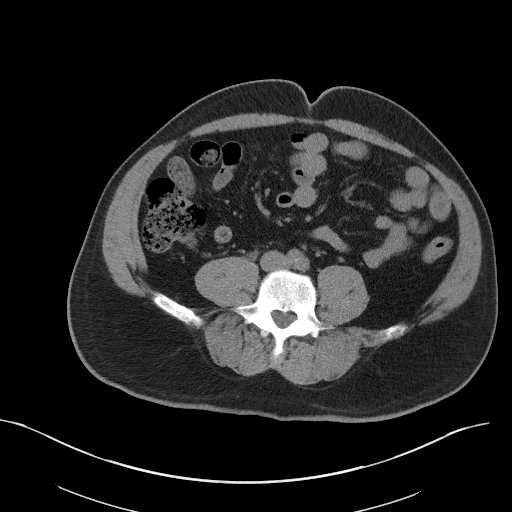
[im 55/109  soft-tissue]
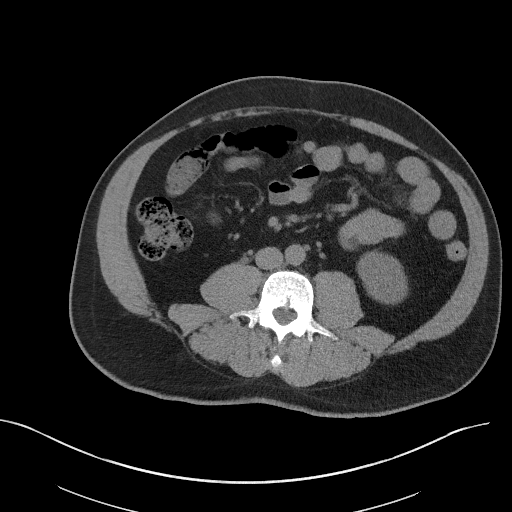
[im 61/109  soft-tissue]
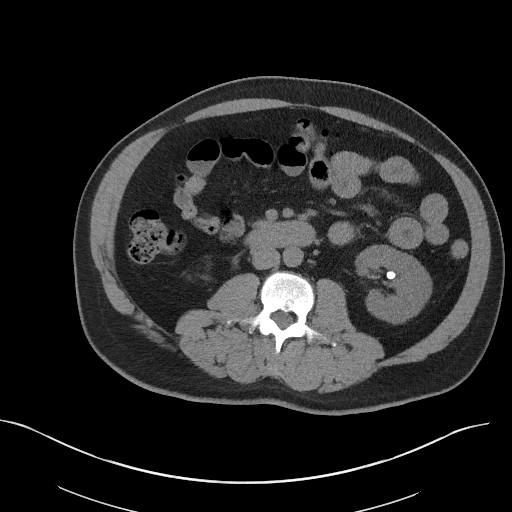
[im 73/109  soft-tissue]
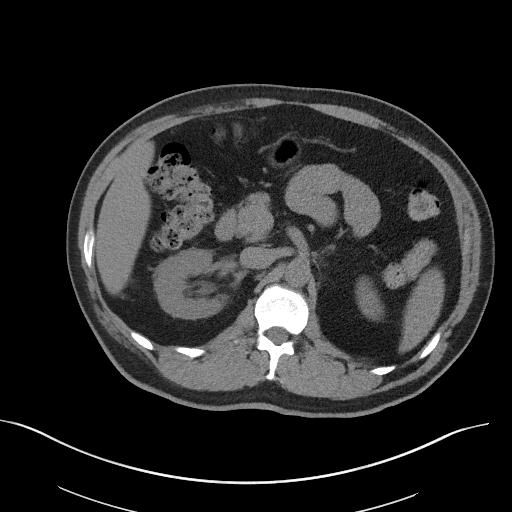
[im 73/109  bone]
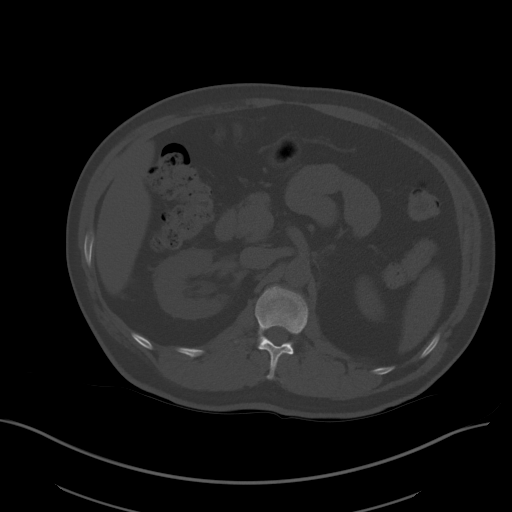
[im 79/109  soft-tissue]
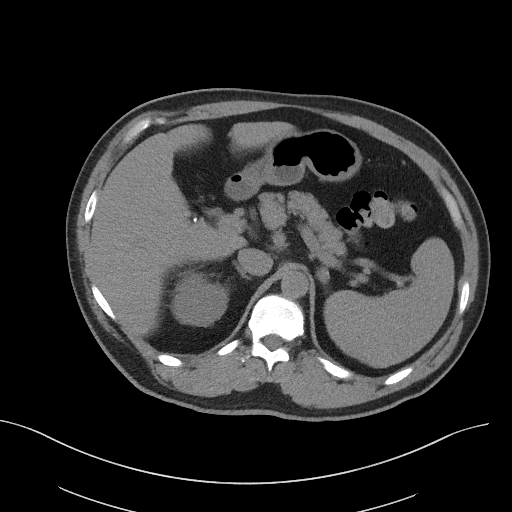
[im 85/109  soft-tissue]
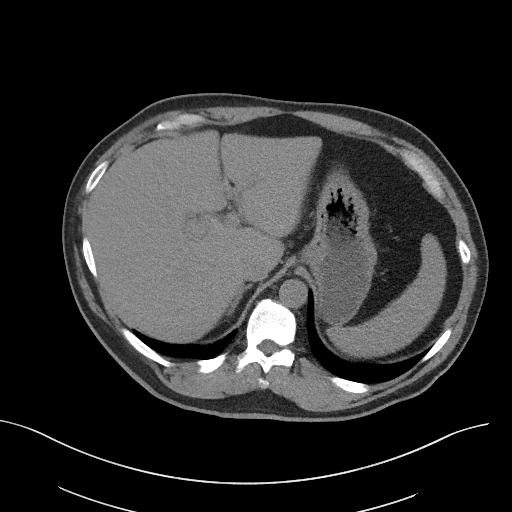
[im 97/109  soft-tissue]
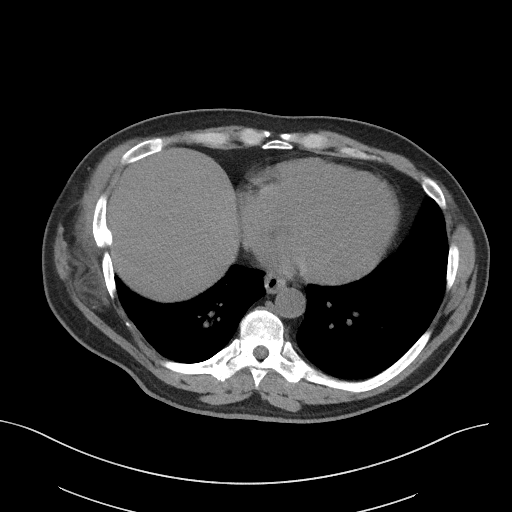
[im 103/109  soft-tissue]
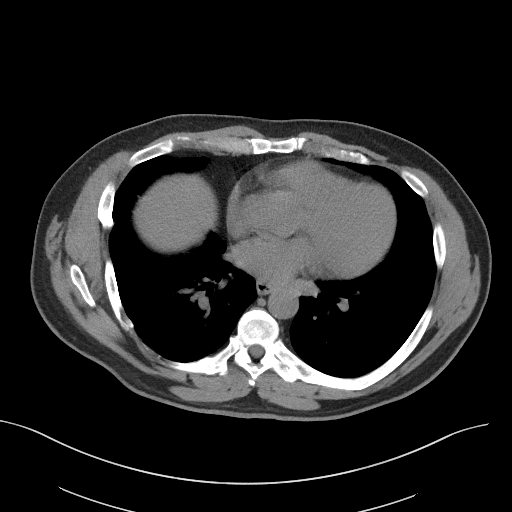

[Series 5: coronal st · coronal · 0.87mm/px · 3 of 108 slices shown]
[im 36/108  soft-tissue]
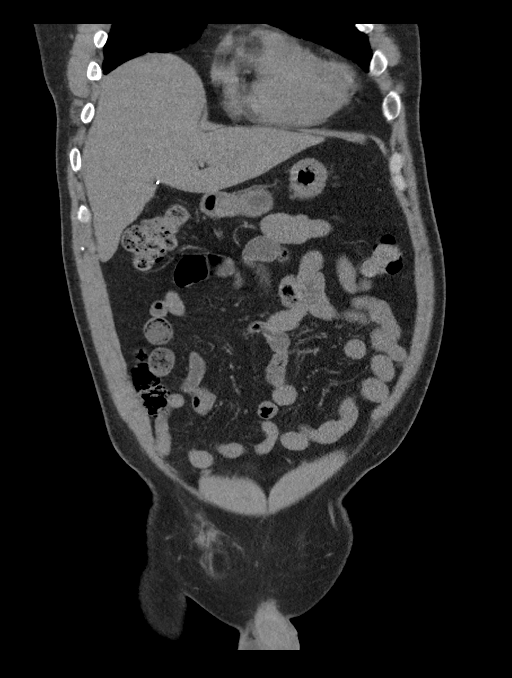
[im 48/108  soft-tissue]
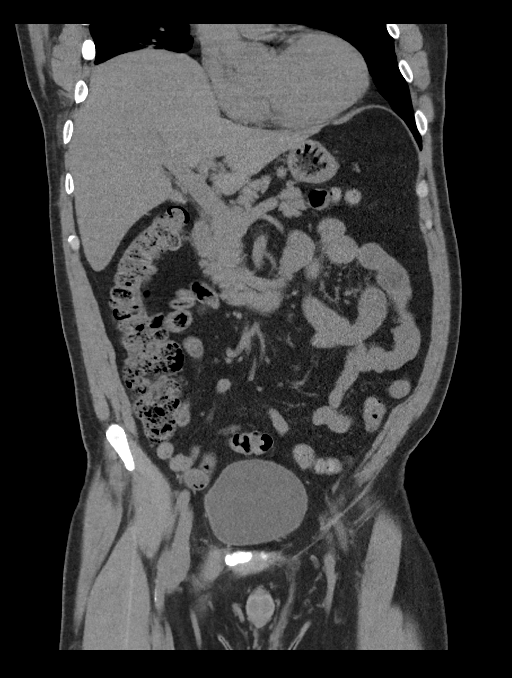
[im 60/108  soft-tissue]
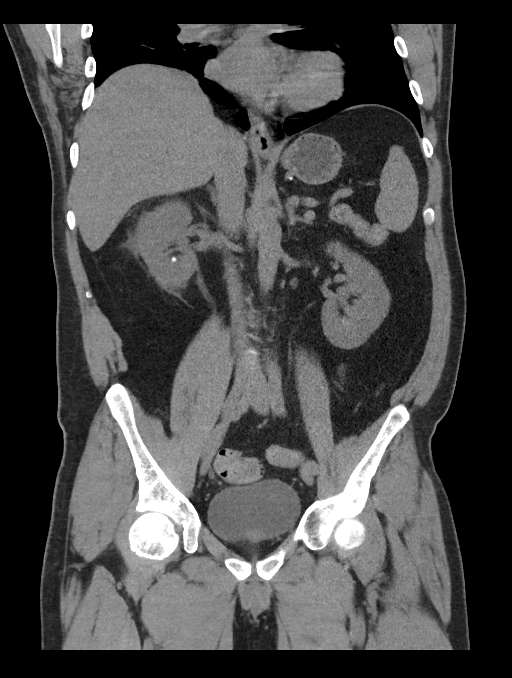

[16 of 46 positions shown; findings below may reference images not displayed]

FINDINGS: Lower chest: Right middle lobe and basilar atelectasis.

Hepatobiliary: No focal liver abnormality is seen. Status post
cholecystectomy. No biliary dilatation.

Pancreas: Unremarkable. No pancreatic ductal dilatation or
surrounding inflammatory changes.

Spleen: Normal in size without focal abnormality.

Adrenals/Urinary Tract: Normal adrenal glands. 7 mm nonobstructing
left renal calculus. Multiple right nephrolithiasis with the largest
in the midpole measuring 4 mm. 3 mm left UVJ renal calculus. No left
hydronephrosis. Mild right hydroureteronephrosis without a
obstructing calculus likely reflecting sequela of a recently passed
calculus. Mild right perinephric stranding. No focal bladder
abnormality.

Stomach/Bowel: Stomach is within normal limits. Appendix appears
normal. No evidence of bowel wall thickening, distention, or
inflammatory changes. Moderate amount of stool in the ascending and
transverse colon.

Vascular/Lymphatic: No significant vascular findings are present. No
enlarged abdominal or pelvic lymph nodes.

Reproductive: Prostate is unremarkable.

Other: No ascites. Fat containing right inguinal hernia. No
abdominal wall hernia.

Musculoskeletal: Multiple old healed right rib fractures. No acute
osseous abnormality. No aggressive osseous lesion. Ankylosis across
the L5-S1 disc space. Ankylosis of the L5 transverse process with
the sacrum bilaterally.
IMPRESSION: 1. Mild RIGHT hydroureteronephrosis without a obstructing calculus
likely reflecting sequela of a recently passed calculus.
2. A 3 mm LEFT UVJ renal calculus.
3. Bilateral nephrolithiasis.
4. Fat containing right inguinal hernia.

## 2023-09-30 DIAGNOSIS — E559 Vitamin D deficiency, unspecified: Secondary | ICD-10-CM | POA: Diagnosis not present

## 2023-09-30 DIAGNOSIS — E7800 Pure hypercholesterolemia, unspecified: Secondary | ICD-10-CM | POA: Diagnosis not present

## 2023-09-30 DIAGNOSIS — I10 Essential (primary) hypertension: Secondary | ICD-10-CM | POA: Diagnosis not present

## 2023-09-30 DIAGNOSIS — E119 Type 2 diabetes mellitus without complications: Secondary | ICD-10-CM | POA: Diagnosis not present

## 2023-09-30 DIAGNOSIS — Z79899 Other long term (current) drug therapy: Secondary | ICD-10-CM | POA: Diagnosis not present

## 2023-12-25 DIAGNOSIS — K080 Exfoliation of teeth due to systemic causes: Secondary | ICD-10-CM | POA: Diagnosis not present

## 2024-03-02 DIAGNOSIS — R1900 Intra-abdominal and pelvic swelling, mass and lump, unspecified site: Secondary | ICD-10-CM | POA: Insufficient documentation

## 2024-03-02 DIAGNOSIS — K219 Gastro-esophageal reflux disease without esophagitis: Secondary | ICD-10-CM | POA: Insufficient documentation

## 2024-03-02 DIAGNOSIS — R1084 Generalized abdominal pain: Secondary | ICD-10-CM | POA: Insufficient documentation

## 2024-03-02 DIAGNOSIS — E7800 Pure hypercholesterolemia, unspecified: Secondary | ICD-10-CM | POA: Insufficient documentation

## 2024-03-04 ENCOUNTER — Ambulatory Visit: Admitting: Student in an Organized Health Care Education/Training Program

## 2024-03-04 ENCOUNTER — Encounter: Payer: Self-pay | Admitting: Student in an Organized Health Care Education/Training Program

## 2024-03-04 VITALS — BP 177/107 | HR 92 | Ht 67.0 in | Wt 187.0 lb

## 2024-03-04 DIAGNOSIS — I447 Left bundle-branch block, unspecified: Secondary | ICD-10-CM | POA: Diagnosis not present

## 2024-03-04 DIAGNOSIS — I10 Essential (primary) hypertension: Secondary | ICD-10-CM

## 2024-03-04 DIAGNOSIS — R1909 Other intra-abdominal and pelvic swelling, mass and lump: Secondary | ICD-10-CM | POA: Insufficient documentation

## 2024-03-04 DIAGNOSIS — R7303 Prediabetes: Secondary | ICD-10-CM | POA: Diagnosis not present

## 2024-03-04 DIAGNOSIS — E7849 Other hyperlipidemia: Secondary | ICD-10-CM

## 2024-03-04 LAB — BASIC METABOLIC PANEL WITH GFR
BUN: 16 mg/dL (ref 6–23)
CO2: 30 meq/L (ref 19–32)
Calcium: 9.7 mg/dL (ref 8.4–10.5)
Chloride: 102 meq/L (ref 96–112)
Creatinine, Ser: 1.21 mg/dL (ref 0.40–1.50)
GFR: 67.46 mL/min (ref >60.00)
Glucose, Bld: 118 mg/dL — ABNORMAL HIGH (ref 70–99)
Potassium: 4.3 meq/L (ref 3.5–5.1)
Sodium: 141 meq/L (ref 135–145)

## 2024-03-04 LAB — LIPID PANEL
Cholesterol: 123 mg/dL (ref 0–200)
HDL: 39.6 mg/dL (ref >39.00)
LDL Cholesterol: 58 mg/dL (ref 0–99)
NonHDL: 83.49
Total CHOL/HDL Ratio: 3
Triglycerides: 127 mg/dL (ref 0.0–149.0)
VLDL: 25.4 mg/dL (ref 0.0–40.0)

## 2024-03-04 LAB — MICROALBUMIN / CREATININE URINE RATIO
Creatinine,U: 115 mg/dL
Microalb Creat Ratio: 20.9 mg/g (ref 0.0–30.0)
Microalb, Ur: 2.4 mg/dL — ABNORMAL HIGH (ref 0.0–1.9)

## 2024-03-04 LAB — HEMOGLOBIN A1C: Hgb A1c MFr Bld: 6.4 % (ref 4.6–6.5)

## 2024-03-04 MED ORDER — LOSARTAN POTASSIUM-HCTZ 50-12.5 MG PO TABS: 1.0000 | ORAL_TABLET | Freq: Every day | ORAL | 2 refills | Status: DC

## 2024-03-04 NOTE — Assessment & Plan Note (Signed)
 Chronic severe asymptomatic hypertension.  Long-standing hypertension remains poorly controlled with the current regimen, compounded by a family history that increases cardiovascular risk. Add Hyzaar to the regimen and continue amlodipine . Recheck blood pressure in one month.

## 2024-03-04 NOTE — Patient Instructions (Signed)
  VISIT SUMMARY: Today, we reviewed your medical history and current medications. We discussed your hypertension, prediabetes, hyperlipidemia, and vitamin D supplementation. We also talked about general health maintenance and lifestyle changes to improve your overall health.  YOUR PLAN: -HYPERTENSION: Hypertension, or high blood pressure, is when the force of the blood against your artery walls is too high, which can lead to heart problems. We will add Hyzaar to your current medication regimen and continue with amlodipine . Please recheck your blood pressure in one month.  -PREDIABETES: Prediabetes means your blood sugar levels are higher than normal but not high enough to be classified as diabetes. Your A1c is currently 6.1%. We will check your A1c today and may consider stopping metformin  if your A1c is under 7%. If we stop metformin , we will recheck your A1c in three months.  -HYPERLIPIDEMIA: Hyperlipidemia is when you have high levels of fats (lipids) in your blood, which increases your risk of heart disease. We will continue your atorvastatin  to manage your cholesterol levels.  -VITAMIN D DEFICIENCY: Vitamin D deficiency means you have lower than normal levels of vitamin D, which is important for bone health. However, we have determined that supplementation is no longer necessary, so you can stop taking vitamin D supplements.  -GENERAL HEALTH MAINTENANCE: For your overall health, we recommend lifestyle changes including 150 minutes of moderate exercise per week and a healthy diet with reduced sugars and carbohydrates.  INSTRUCTIONS: Please recheck your blood pressure in one month. We will check your A1c today and may stop metformin  if it is under 7%. If metformin  is stopped, recheck your A1c in three months.

## 2024-03-04 NOTE — Assessment & Plan Note (Signed)
 History of hyperlipidemia, no history of ischemic events.  Currently using atorvastatin  without side effects.  Will check lipids today.  Goal LDL less than 100.

## 2024-03-04 NOTE — Assessment & Plan Note (Signed)
 History of a chronic left bundle branch block for many years.  He has had ischemic evaluation which was reassuring, and echocardiogram with no structural heart disease.  This is a chronic issue but not bothering him.
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
## 2024-03-04 NOTE — Assessment & Plan Note (Signed)
 History of A1c around 6.1%.  He was treated with metformin , but wants to reduce his pill burden.  We talked about the limited benefits of metformin  for the treatment of prediabetes.  Check A1c today and consider stopping metformin  if A1c is under 7%. Recheck A1c in three months if metformin  is stopped.

## 2024-03-05 ENCOUNTER — Ambulatory Visit: Payer: Self-pay | Admitting: Student in an Organized Health Care Education/Training Program

## 2024-03-05 ENCOUNTER — Encounter: Payer: Self-pay | Admitting: Student in an Organized Health Care Education/Training Program

## 2024-03-05 NOTE — Telephone Encounter (Signed)
 Lab Letter was printed by Dr.Vincent. Called patient to relay labs.   Lab results have been discussed.   Verbalized understanding? Yes  Are there any questions? No   Patient requested that we do not mail the letter.

## 2024-03-05 NOTE — Telephone Encounter (Signed)
 Letter mailed

## 2024-03-05 NOTE — Telephone Encounter (Unsigned)
 Copied from CRM [id number]. Topic: Clinical - Lab/Test Results >> Mar 05, 2024 10:05 AM Franky GRADE wrote: Reason for CRM: Patient would like for his lab results to be mailed to his home if possible.

## 2024-04-02 ENCOUNTER — Ambulatory Visit: Admitting: Student in an Organized Health Care Education/Training Program

## 2024-04-02 ENCOUNTER — Encounter: Payer: Self-pay | Admitting: Student in an Organized Health Care Education/Training Program

## 2024-04-02 VITALS — BP 142/92 | HR 114 | Resp 16 | Ht 67.0 in | Wt 190.0 lb

## 2024-04-02 DIAGNOSIS — I10 Essential (primary) hypertension: Secondary | ICD-10-CM | POA: Diagnosis not present

## 2024-04-02 DIAGNOSIS — R7303 Prediabetes: Secondary | ICD-10-CM

## 2024-04-02 MED ORDER — LOSARTAN POTASSIUM-HCTZ 100-25 MG PO TABS
1.0000 | ORAL_TABLET | Freq: Every day | ORAL | 3 refills | Status: AC
Start: 2024-04-02 — End: ?

## 2024-04-02 MED ORDER — METFORMIN HCL ER 500 MG PO TB24: 500.0000 mg | ORAL_TABLET | Freq: Every day | ORAL | 3 refills | Status: AC

## 2024-04-02 NOTE — Assessment & Plan Note (Signed)
 Blood pressure has improved but remains above target, currently at 148/82 mmHg with a goal of <130/80 mmHg. No medication side effects reported. Increased losartan-hydrochlorothiazide to 100-25 mg daily. He will take two 50 mg tablets until the current supply is exhausted, then switch to 100 mg tablets. Follow-up scheduled in one month to monitor blood pressure and medication tolerance.  Continue amlodipine  5 mg daily.  Check BMP today.

## 2024-04-02 NOTE — Patient Instructions (Signed)
  VISIT SUMMARY: Today, we reviewed your blood pressure and blood sugar levels. Your blood pressure has improved with your current medications, but it is still above the target range. We also discussed your prediabetes and family history of heart disease.  YOUR PLAN: -ESSENTIAL HYPERTENSION: Essential hypertension means high blood pressure without a known cause. Your blood pressure has improved but is still above the target. We have increased your losartan-hydrochlorothiazide dose to 100 mg daily. Please take two 50 mg tablets until your current supply runs out, then switch to the 100 mg tablets. We will follow up in one month to monitor your blood pressure and how you are tolerating the medication.  -PREDIABETES: Prediabetes means your blood sugar levels are higher than normal but not high enough to be classified as diabetes. Your A1c is 6.4%, which indicates prediabetes. We have reduced your metformin  to 500 mg extended release once daily. We will check your A1c again in a few months to monitor your blood sugar levels.  INSTRUCTIONS: Please follow up in one month to monitor your blood pressure and medication tolerance. We will also check your A1c in a few months to monitor your blood sugar levels.

## 2024-04-02 NOTE — Progress Notes (Signed)
   Established Patient Office Visit  Patient ID: Kyle [name], male    DOB: 1971/04/07  Age: 53 y.o. MRN: [id number] PCP: Jerrell Cleatus Ned, MD  Chief Complaint  Patient presents with   Medical Management of Chronic Issues    Patient presents today for hypertension follow-up. He reports compliance with medications and no side effects.    Subjective:     HPI  Discussed the use of AI scribe software for clinical note transcription with the patient, who gave verbal consent to proceed.  History of Present Illness Kyle [name] is a 53 year old male with hypertension and prediabetes who presents for follow-up of blood pressure management.  He has been taking amlodipine  and recently started on losartan HCTZ, one tablet once a day, without any side effects. He takes the medication consistently every day. His blood pressure has improved from 177/107 mmHg to 148/82 mmHg, but it was 142/92 mmHg earlier today, which he attributes to stress.  He is now taking metformin  once a day. His last A1c was 6.4% on March 04, 2024. He mentions a family history of heart disease, with his father having passed away from a massive heart attack at a young age, which influences his decision to continue medication.  He works full-time for an [employer]. His wife is currently out of town and he has a list of tasks to complete in her absence. He has not been checking his blood pressure at home. No side effects from his current medications and no new symptoms.    Objective:     BP (!) 142/92   Pulse (!) 114   Resp 16   Ht 5' 7 (1.702 m)   Wt 190 lb (86.2 kg)   SpO2 98%   BMI 29.76 kg/m   Physical Exam  Gen: Well-appearing man Heart: Regular, no murmur Lungs: Unlabored, clear throughout Ext: Warm, no edema    Assessment & Plan:   Problem List Items Addressed This Visit       High   Essential hypertension - Primary (Chronic)   Blood pressure has improved but remains  above target, currently at 148/82 mmHg with a goal of <130/80 mmHg. No medication side effects reported. Increased losartan-hydrochlorothiazide to 100-25 mg daily. He will take two 50 mg tablets until the current supply is exhausted, then switch to 100 mg tablets. Follow-up scheduled in one month to monitor blood pressure and medication tolerance.  Continue amlodipine  5 mg daily.  Check BMP today.      Relevant Medications   losartan-hydrochlorothiazide (HYZAAR) 100-25 MG tablet   Other Relevant Orders   Basic metabolic panel with GFR     Medium    Prediabetes (Chronic)   A1c is 6.4%, indicating prediabetes. Metformin  is not prioritized unless A1c exceeds 7%. He prefers to continue due to family history of cardiovascular disease. Reduced metformin  to 500 mg extended release once daily. A1c will be checked in a few months to monitor glucose control.      Relevant Medications   metFORMIN  (GLUCOPHAGE -XR) 500 MG 24 hr tablet    Return in about 4 weeks (around 04/30/2024).    Cleatus Ned Jerrell, MD [facility] at [facility]

## 2024-04-02 NOTE — Assessment & Plan Note (Signed)
 A1c is 6.4%, indicating prediabetes. Metformin  is not prioritized unless A1c exceeds 7%. He prefers to continue due to family history of cardiovascular disease. Reduced metformin  to 500 mg extended release once daily. A1c will be checked in a few months to monitor glucose control.

## 2024-04-03 LAB — BASIC METABOLIC PANEL WITH GFR
BUN: 19 mg/dL (ref 7–25)
CO2: 31 mmol/L (ref 20–32)
Calcium: 10.2 mg/dL (ref 8.6–10.3)
Chloride: 101 mmol/L (ref 98–110)
Creat: 1.27 mg/dL (ref 0.70–1.30)
Glucose, Bld: 129 mg/dL — ABNORMAL HIGH (ref 65–99)
Potassium: 4.2 mmol/L (ref 3.5–5.3)
Sodium: 140 mmol/L (ref 135–146)
eGFR: 67 mL/min/1.73m2 (ref >=60)

## 2024-04-05 ENCOUNTER — Ambulatory Visit: Payer: Self-pay | Admitting: Student in an Organized Health Care Education/Training Program

## 2024-05-07 ENCOUNTER — Ambulatory Visit: Admitting: Student in an Organized Health Care Education/Training Program

## 2024-05-07 ENCOUNTER — Encounter: Payer: Self-pay | Admitting: Student in an Organized Health Care Education/Training Program

## 2024-05-07 VITALS — BP 140/80 | HR 82 | Wt 194.0 lb

## 2024-05-07 DIAGNOSIS — I10 Essential (primary) hypertension: Secondary | ICD-10-CM | POA: Diagnosis not present

## 2024-05-07 NOTE — Assessment & Plan Note (Signed)
 Blood pressure improved to 140/80 mmHg with increased Hyzaar dose. No adverse effects reported. Continue Amlodipine  5 mg daily and Losartan -hydrochlorothiazide 100-25 mg daily. Encourage home blood pressure monitoring once or twice a week and record readings. Ordered blood work to check potassium levels. Schedule follow-up in four months.

## 2024-05-07 NOTE — Patient Instructions (Signed)
°  VISIT SUMMARY: Today, you came in for a follow-up visit to check on your blood pressure after a recent medication adjustment. You reported feeling great with no side effects from the increased dose of Hyzaar. You also mentioned some weight gain due to travel and shared your experience of anxiety when visiting the clinic. Additionally, you have a vacation planned to [place] in May for your birthday.  YOUR PLAN: -ESSENTIAL HYPERTENSION: Essential hypertension is high blood pressure with no identifiable cause. Your blood pressure has improved to 140/80 mmHg with the increased dose of Hyzaar, and you reported no adverse effects. Continue taking Amlodipine  5 mg daily and Losartan -hydrochlorothiazide 100-25 mg daily. Please monitor your blood pressure at home once or twice a week and record the readings. We will also check your potassium levels with a blood test. Follow-up is scheduled in four months.  -PREDIABETES: Prediabetes is a condition where blood sugar levels are higher than normal but not high enough to be classified as diabetes. You are managing this with Metformin  500 mg once daily, preferably at night for convenience. Continue with your current regimen.  INSTRUCTIONS: Please monitor your blood pressure at home once or twice a week and record the readings. We will also check your potassium levels with a blood test. Your follow-up appointment is scheduled in four months.

## 2024-05-07 NOTE — Progress Notes (Signed)
° °  Established Patient Office Visit  Patient ID: Kyle [name], male    DOB: [birth year]  Age: 53 y.o. MRN: [id number] PCP: Jerrell Cleatus Ned, MD  Chief Complaint  Patient presents with   Hypertension    4 week follow up      Subjective:     HPI  Discussed the use of AI scribe software for clinical note transcription with the patient, who gave verbal consent to proceed.  History of Present Illness Kyle [name] is a 53 year old male with hypertension who presents for follow-up after a medication adjustment.  A month ago, his blood pressure medication, Hyzaar, was increased. He has been taking the new dose for two to three days and reports no issues such as lightheadedness or dizziness. He states, 'I actually feel great.' He has not checked his blood pressure at home recently due to being out of town for work for two weeks. He notes a weight gain during this period, attributing it to difficulty maintaining a healthy diet while traveling.  He is currently on multiple medications including amlodipine , a combination of losartan  and hydrochlorothiazide (Hyzaar), metformin , and Lipitor. He takes these medications at night and feels comfortable with this routine. He mentions that he used to take metformin  twice a day but often forgot the morning dose on weekends, so he now takes it once daily at night with his other medications.  He describes experiencing anxiety when visiting the clinic, noting that his 'core gets, starts warming' and he feels nervous, which he associates with an increase in blood pressure. His wife refers to him as a 'worry wart.'  He also shares that he has a vacation planned to [place] in May, which will be a birthday celebration for him as he turns 54.     Objective:     BP (!) 140/80   Pulse 82   Wt 194 lb (88 kg)   SpO2 99%   BMI 30.38 kg/m   Physical Exam  Gen: Well-appearing man Neck: Normal thyroid, no nodules or adenopathy Heart:  Regular, no murmur Lungs: Unlabored, clear throughout Ext: warm, no edema:     Assessment & Plan:   Problem List Items Addressed This Visit       High   Essential hypertension - Primary (Chronic)   Blood pressure improved to 140/80 mmHg with increased Hyzaar dose. No adverse effects reported. Continue Amlodipine  5 mg daily and Losartan -hydrochlorothiazide 100-25 mg daily. Encourage home blood pressure monitoring once or twice a week and record readings. Ordered blood work to check potassium levels. Schedule follow-up in four months.      Relevant Orders   Basic metabolic panel with GFR     Return in about 4 months (around 09/05/2024).    Cleatus Ned Jerrell, MD [place] [facility] at [facility]

## 2024-05-08 LAB — BASIC METABOLIC PANEL WITH GFR
BUN/Creatinine Ratio: 16 (calc) (ref 6–22)
BUN: 22 mg/dL (ref 7–25)
CO2: 27 mmol/L (ref 20–32)
Calcium: 9.1 mg/dL (ref 8.6–10.3)
Chloride: 100 mmol/L (ref 98–110)
Creat: 1.39 mg/dL — ABNORMAL HIGH (ref 0.70–1.30)
Glucose, Bld: 272 mg/dL — ABNORMAL HIGH (ref 65–99)
Potassium: 3.9 mmol/L (ref 3.5–5.3)
Sodium: 137 mmol/L (ref 135–146)
eGFR: 60 mL/min/1.73m2 (ref >=60)

## 2024-05-10 ENCOUNTER — Ambulatory Visit: Payer: Self-pay | Admitting: Student in an Organized Health Care Education/Training Program

## 2024-09-09 ENCOUNTER — Ambulatory Visit: Admitting: Student in an Organized Health Care Education/Training Program
